# Patient Record
Sex: Female | Born: 1960 | Race: White | Hispanic: No | Marital: Married | Smoking: Current every day smoker
Health system: Southern US, Community
[De-identification: ages and names within clinical notes are randomized; demographics above are authoritative.]

## PROBLEM LIST (undated history)

## (undated) DIAGNOSIS — F329 Major depressive disorder, single episode, unspecified: Secondary | ICD-10-CM

## (undated) DIAGNOSIS — Z86010 Personal history of colonic polyps: Secondary | ICD-10-CM

## (undated) DIAGNOSIS — F32A Depression, unspecified: Secondary | ICD-10-CM

## (undated) DIAGNOSIS — M1990 Unspecified osteoarthritis, unspecified site: Secondary | ICD-10-CM

## (undated) DIAGNOSIS — K219 Gastro-esophageal reflux disease without esophagitis: Secondary | ICD-10-CM

## (undated) HISTORY — DX: Depression, unspecified: F32.A

## (undated) HISTORY — DX: Personal history of colonic polyps: Z86.010

## (undated) HISTORY — DX: Major depressive disorder, single episode, unspecified: F32.9

---

## 2011-06-05 DIAGNOSIS — Z86010 Personal history of colonic polyps: Secondary | ICD-10-CM

## 2011-06-05 HISTORY — DX: Personal history of colonic polyps: Z86.010

## 2011-09-03 HISTORY — PX: COLONOSCOPY: SHX174

## 2012-09-10 ENCOUNTER — Other Ambulatory Visit (HOSPITAL_COMMUNITY): Payer: Self-pay | Admitting: Orthopaedic Surgery

## 2012-09-10 DIAGNOSIS — M25561 Pain in right knee: Secondary | ICD-10-CM

## 2012-09-12 ENCOUNTER — Encounter (HOSPITAL_COMMUNITY): Payer: Self-pay

## 2012-09-12 ENCOUNTER — Ambulatory Visit (HOSPITAL_COMMUNITY)
Admission: RE | Admit: 2012-09-12 | Discharge: 2012-09-12 | Disposition: A | Discharge status: Home / Self Care | Payer: Medicaid Other | Source: Ambulatory Visit | Attending: Orthopaedic Surgery | Admitting: Orthopaedic Surgery

## 2012-09-12 DIAGNOSIS — M25561 Pain in right knee: Secondary | ICD-10-CM

## 2012-09-12 DIAGNOSIS — M84453A Pathological fracture, unspecified femur, initial encounter for fracture: Secondary | ICD-10-CM | POA: Insufficient documentation

## 2012-09-12 DIAGNOSIS — M25569 Pain in unspecified knee: Secondary | ICD-10-CM | POA: Insufficient documentation

## 2012-09-18 ENCOUNTER — Encounter: Payer: Self-pay | Admitting: Gastroenterology

## 2012-09-18 ENCOUNTER — Ambulatory Visit (INDEPENDENT_AMBULATORY_CARE_PROVIDER_SITE_OTHER): Payer: Medicaid Other | Admitting: Gastroenterology

## 2012-09-18 VITALS — BP 119/67 | HR 83 | Temp 97.8°F | Ht 65.0 in | Wt 196.0 lb

## 2012-09-18 DIAGNOSIS — R1011 Right upper quadrant pain: Secondary | ICD-10-CM

## 2012-09-18 DIAGNOSIS — K219 Gastro-esophageal reflux disease without esophagitis: Secondary | ICD-10-CM

## 2012-09-18 DIAGNOSIS — K5900 Constipation, unspecified: Secondary | ICD-10-CM | POA: Insufficient documentation

## 2012-09-18 NOTE — Patient Instructions (Addendum)
Continue pantoprazole for heartburn and abdominal pain.  Resume MiraLax 1 capful twice a day for 3 days, then 1 capful daily as needed for constipation.  We will request a copy of your abdominal ultrasound and recent blood work from Dr. Margaretmary Eddy office. We have scheduled you for an upper endoscopy with Dr. Jena Gauss. Please see separate instructions.

## 2012-09-18 NOTE — Progress Notes (Signed)
Primary Care Physician:  Kirstie Peri, MD  Primary Gastroenterologist:  Roetta Sessions, MD   Chief Complaint  Patient presents with  . Abdominal Pain  . Bloated  . Constipation    HPI:  Crystal Deleon is a 52 y.o. female here for further evaluation fo abdominal pain, bloating, constipation. Symptoms present for over one year. PP RUQ swelling and feels pressure under right rib. Lot of flatulence. Intermittent RUQ pain. Gained a lot of weight up about 35 pounds. Swelling in RUQ if walks a lot. C/O constipation. Frequent straining. No melena, brbpr. Could not quantify how often. Positive for mucous. Has been on Vicodin for 6 weeks for knee. Just started naproxen two weeks ago. Heartburn real bad. Rolaids daily.  Four episodes of severe burning epigastric pain, 10-15 minutes, radiating into neck. Protonix just started. Previously on Nexium but didn't help. No esophageal dysphagia.   Current Outpatient Prescriptions  Medication Sig Dispense Refill  . citalopram (CELEXA) 20 MG tablet Take 20 mg by mouth daily.      Marland Kitchen HYDROcodone-acetaminophen (NORCO/VICODIN) 5-325 MG per tablet Take 1 tablet by mouth every 6 (six) hours as needed for pain.      . naproxen (NAPROSYN) 500 MG tablet Take 500 mg by mouth 2 (two) times daily with a meal.      . pantoprazole (PROTONIX) 40 MG tablet Take 40 mg by mouth daily.       No current facility-administered medications for this visit.    Allergies as of 09/18/2012  . (No Known Allergies)    Past Medical History  Diagnosis Date  . Depression   . Hx of adenomatous colonic polyps 2013    Past Surgical History  Procedure Laterality Date  . Colonoscopy  09/2011    Dr. Adah Perl diverticulosis in sigmoid colon. Multiple tubular adenomas removed (4). Recommended to have next TCS in 09/2014     Family History  Problem Relation Age of Onset  . Colon cancer Maternal Uncle     greater than age 29  . Liver disease Neg Hx   . Diabetes Mother   .  Diabetes Father     History   Social History  . Marital Status: Married    Spouse Name: N/A    Number of Children: 5  . Years of Education: N/A   Occupational History  . Not on file.   Social History Main Topics  . Smoking status: Current Every Day Smoker -- 0.50 packs/day    Types: Cigarettes  . Smokeless tobacco: Not on file  . Alcohol Use: No  . Drug Use: No  . Sexually Active: Not on file   Other Topics Concern  . Not on file   Social History Narrative  . No narrative on file      ROS:  General: Negative for anorexia, weight loss, fever, chills, fatigue, weakness. Eyes: Negative for vision changes.  ENT: Negative for hoarseness, difficulty swallowing , nasal congestion. CV: Negative for chest pain, angina, palpitations, dyspnea on exertion, peripheral edema.  Respiratory: Negative for dyspnea at rest, dyspnea on exertion, cough, sputum, wheezing.  GI: See history of present illness. GU:  Negative for dysuria, hematuria, urinary incontinence, urinary frequency, nocturnal urination.  MS: Positive for joint pain. No low back pain.  Derm: Negative for rash or itching.  Neuro: Negative for weakness, abnormal sensation, seizure, frequent headaches, memory loss, confusion.  Psych: Negative for anxiety, depression, suicidal ideation, hallucinations.  Endo: Weight gain.  Heme: Negative for bruising or bleeding. Allergy: Negative  for rash or hives.    Physical Examination:  BP 119/67  Pulse 83  Temp(Src) 97.8 F (36.6 C) (Oral)  Ht 5\' 5"  (1.651 m)  Wt 196 lb (88.905 kg)  BMI 32.62 kg/m2   General: Well-nourished, well-developed in no acute distress.  Head: Normocephalic, atraumatic.   Eyes: Conjunctiva pink, no icterus. Mouth: Oropharyngeal mucosa moist and pink , no lesions erythema or exudate. Neck: Supple without thyromegaly, masses, or lymphadenopathy.  Lungs: Clear to auscultation bilaterally.  Heart: Regular rate and rhythm, no murmurs rubs or gallops.   Abdomen: Bowel sounds are normal, RUQ tenderness with palpation, nondistended, no hepatosplenomegaly or masses, no abdominal bruits or    hernia , no rebound or guarding.   Rectal: not performed Extremities: No lower extremity edema. No clubbing or deformities.  Neuro: Alert and oriented x 4 , grossly normal neurologically.  Skin: Warm and dry, no rash or jaundice.   Psych: Alert and cooperative, normal mood and affect.  Labs: Requested labs from PCP.

## 2012-09-19 NOTE — Assessment & Plan Note (Signed)
53 y/o female with one year h/o postprandial RUQ pain, swelling, refractory GERD. Recommend EGD to evaluate for gastritis, PUD, complicated GERD.  I have discussed the risks, alternatives, benefits with regards to but not limited to the risk of reaction to medication, bleeding, infection, perforation and the patient is agreeable to proceed. Written consent to be obtained.  I have requested labs and abd u/s from [facility]/Dr. Sherryll Burger for review.

## 2012-09-19 NOTE — Assessment & Plan Note (Signed)
Trial of Miralax.

## 2012-09-19 NOTE — Progress Notes (Signed)
Received records from [facility]/Dr. Sherryll Burger Labs from 10 /2013: WBC 11000, H/H 14/41.4, Platelet 283000, Cre 0.62,Ca 10.1, Tbili 0.2, AP 137H, AST 20, ALT 26, Alb 4.3  Abd u/s on 01/2012: Fatty liver.

## 2012-09-19 NOTE — Assessment & Plan Note (Signed)
Recently started Protonix, continue for now. EGD as planned.

## 2012-09-22 ENCOUNTER — Encounter: Payer: Self-pay | Admitting: Gastroenterology

## 2012-09-22 NOTE — Progress Notes (Signed)
Cc PCP 

## 2012-09-24 LAB — COMPREHENSIVE METABOLIC PANEL
ALT: 26 U/L (ref 7–35)
BUN: 12 mg/dL (ref 4–21)
Calcium: 10.1 mg/dL
Creat: 0.62
Glucose: 110
Total Bilirubin: 0.2 mg/dL

## 2012-09-24 LAB — CBC: Hemoglobin: 14 g/dL (ref 12.0–16.0)

## 2012-10-06 ENCOUNTER — Encounter (HOSPITAL_COMMUNITY)
Admission: RE | Disposition: A | Discharge status: Home / Self Care | Payer: Self-pay | Source: Ambulatory Visit | Attending: Internal Medicine

## 2012-10-06 ENCOUNTER — Ambulatory Visit (HOSPITAL_COMMUNITY)
Admission: RE | Admit: 2012-10-06 | Discharge: 2012-10-06 | Disposition: A | Discharge status: Home / Self Care | Payer: Medicaid Other | Source: Ambulatory Visit | Attending: Internal Medicine | Admitting: Internal Medicine

## 2012-10-06 ENCOUNTER — Encounter (HOSPITAL_COMMUNITY): Payer: Self-pay | Admitting: *Deleted

## 2012-10-06 DIAGNOSIS — R141 Gas pain: Secondary | ICD-10-CM | POA: Insufficient documentation

## 2012-10-06 DIAGNOSIS — Z860100 Personal history of colon polyps, unspecified: Secondary | ICD-10-CM | POA: Insufficient documentation

## 2012-10-06 DIAGNOSIS — F329 Major depressive disorder, single episode, unspecified: Secondary | ICD-10-CM | POA: Insufficient documentation

## 2012-10-06 DIAGNOSIS — K228 Other specified diseases of esophagus: Secondary | ICD-10-CM | POA: Insufficient documentation

## 2012-10-06 DIAGNOSIS — K5900 Constipation, unspecified: Secondary | ICD-10-CM | POA: Insufficient documentation

## 2012-10-06 DIAGNOSIS — K2940 Chronic atrophic gastritis without bleeding: Secondary | ICD-10-CM | POA: Insufficient documentation

## 2012-10-06 DIAGNOSIS — K210 Gastro-esophageal reflux disease with esophagitis: Secondary | ICD-10-CM

## 2012-10-06 DIAGNOSIS — F3289 Other specified depressive episodes: Secondary | ICD-10-CM | POA: Insufficient documentation

## 2012-10-06 DIAGNOSIS — K2289 Other specified disease of esophagus: Secondary | ICD-10-CM | POA: Insufficient documentation

## 2012-10-06 DIAGNOSIS — R142 Eructation: Secondary | ICD-10-CM | POA: Insufficient documentation

## 2012-10-06 DIAGNOSIS — K219 Gastro-esophageal reflux disease without esophagitis: Secondary | ICD-10-CM | POA: Insufficient documentation

## 2012-10-06 DIAGNOSIS — K2960 Other gastritis without bleeding: Secondary | ICD-10-CM

## 2012-10-06 DIAGNOSIS — Z800 Family history of malignant neoplasm of digestive organs: Secondary | ICD-10-CM | POA: Insufficient documentation

## 2012-10-06 DIAGNOSIS — Z79899 Other long term (current) drug therapy: Secondary | ICD-10-CM | POA: Insufficient documentation

## 2012-10-06 DIAGNOSIS — R1011 Right upper quadrant pain: Secondary | ICD-10-CM | POA: Insufficient documentation

## 2012-10-06 DIAGNOSIS — F17200 Nicotine dependence, unspecified, uncomplicated: Secondary | ICD-10-CM | POA: Insufficient documentation

## 2012-10-06 HISTORY — PX: ESOPHAGOGASTRODUODENOSCOPY: SHX5428

## 2012-10-06 SURGERY — EGD (ESOPHAGOGASTRODUODENOSCOPY)
Anesthesia: Moderate Sedation

## 2012-10-06 MED ORDER — ONDANSETRON HCL 4 MG/2ML IJ SOLN
INTRAMUSCULAR | Status: DC | PRN
  Administered 2012-10-06: 4 mg via INTRAVENOUS

## 2012-10-06 MED ORDER — MIDAZOLAM HCL 5 MG/5ML IJ SOLN
INTRAMUSCULAR | Status: DC | PRN
  Administered 2012-10-06 (×2): 2 mg via INTRAVENOUS

## 2012-10-06 MED ORDER — SODIUM CHLORIDE 0.9 % IV SOLN
INTRAVENOUS | Status: DC
  Administered 2012-10-06: 13:00:00 via INTRAVENOUS

## 2012-10-06 MED ORDER — MEPERIDINE HCL 100 MG/ML IJ SOLN
INTRAMUSCULAR | Status: AC
  Filled 2012-10-06: qty 2

## 2012-10-06 MED ORDER — BUTAMBEN-TETRACAINE-BENZOCAINE 2-2-14 % EX AERO
INHALATION_SPRAY | CUTANEOUS | Status: DC | PRN
  Administered 2012-10-06: 2 via TOPICAL

## 2012-10-06 MED ORDER — MEPERIDINE HCL 100 MG/ML IJ SOLN
INTRAMUSCULAR | Status: DC | PRN
  Administered 2012-10-06: 25 mg via INTRAVENOUS
  Administered 2012-10-06: 50 mg via INTRAVENOUS

## 2012-10-06 MED ORDER — STERILE WATER FOR IRRIGATION IR SOLN
Status: DC | PRN
  Administered 2012-10-06: 13:00:00

## 2012-10-06 MED ORDER — ONDANSETRON HCL 4 MG/2ML IJ SOLN
INTRAMUSCULAR | Status: AC
  Filled 2012-10-06: qty 2

## 2012-10-06 MED ORDER — MIDAZOLAM HCL 5 MG/5ML IJ SOLN
INTRAMUSCULAR | Status: AC
  Filled 2012-10-06: qty 10

## 2012-10-06 NOTE — Interval H&P Note (Signed)
History and Physical Interval Note:  10/06/2012 1:24 PM  Crystal Deleon  has presented today for surgery, with the diagnosis of RUQ Pain, GERD and Constipation  The various methods of treatment have been discussed with the patient and family. After consideration of risks, benefits and other options for treatment, the patient has consented to  Procedure(s) with comments: ESOPHAGOGASTRODUODENOSCOPY (EGD) (N/A) - 1:00-moved to [address] to notify pt as a surgical intervention .  The patient's history has been reviewed, patient examined, no change in status, stable for surgery.  I have reviewed the patient's chart and labs.  Questions were answered to the patient's satisfaction.     Continues to have postprandial right upper quadrant abdominal pain. Protonix has not helped. No dysphagia. EGD today per per plan.  The risks, benefits, limitations, alternatives and imponderables have been reviewed with the patient. Potential for esophageal dilation, biopsy, etc. have also been reviewed.  Questions have been answered. All parties agreeable.  Eula Listen

## 2012-10-06 NOTE — Op Note (Signed)
[facility] [address] [place], [address]   ENDOSCOPY PROCEDURE REPORT  PATIENT: Crystal, Deleon  MR#: [id number] BIRTHDATE: [birth year] , 52  yrs. old GENDER: Female ENDOSCOPIST: R.  Roetta Sessions, MD FACP FACG REFERRED BY:  Kirstie Peri, M.D. PROCEDURE DATE:  10/06/2012 PROCEDURE:     EGD with gastric biopsy  INDICATIONS:     Right upper quadrant abdominal pain; no improvement with a course of Protonix  INFORMED CONSENT:   The risks, benefits, limitations, alternatives and imponderables have been discussed.  The potential for biopsy, esophogeal dilation, etc. have also been reviewed.  Questions have been answered.  All parties agreeable.  Please see the history and physical in the medical record for more information.  MEDICATIONS:  Versed 4 mg IV and Demerol 75 mg IV and Zofran 4 mg IV. Cetacaine spray to  DESCRIPTION OF PROCEDURE:   The EG-[id number] ([id number])  endoscope was introduced through the mouth and advanced to the second portion of the duodenum without difficulty or limitations.  The mucosal surfaces were surveyed very carefully during advancement of the scope and upon withdrawal.  Retroflexion view of the proximal stomach and esophagogastric junction was performed.      FINDINGS: Small esophageal erosions within 5 mm the GE junction. No Barrett's esophagus. Stomach empty. Antral and body erosions. No ulcer or infiltrating process. Patent pylorus. Examination first and second portion of duodenum revealed multiple areas of bulbar rouge surrounding erythema. No ulcer or infiltrating process observed.  THERAPEUTIC / DIAGNOSTIC MANEUVERS PERFORMED:  Biopsies abnormal gastric antrum and body taken for histologic study   COMPLICATIONS:  None  IMPRESSION:   Mild erosive reflux esophagitis. Gastric and bulbar erosions of uncertain significance-status post gastric biopsy. Patient's symptoms do not at all sound entirely typical for  reflux.  RECOMMENDATIONS:  Continue Protonix 40 mg daily. Followup on pathology.    _______________________________ R. Roetta Sessions, MD FACP [facility] eSigned:  R. Roetta Sessions, MD FACP [facility] 10/06/2012 1:52 PM     CC:  PATIENT NAME:  Crystal, Deleon MR#: [id number]

## 2012-10-06 NOTE — H&P (View-Only) (Signed)
Primary Care Physician:  SHAH,ASHISH, MD  Primary Gastroenterologist:  Michael Rourk, MD   Chief Complaint  Patient presents with  . Abdominal Pain  . Bloated  . Constipation    HPI:  Crystal Deleon is a 52 y.o. female here for further evaluation fo abdominal pain, bloating, constipation. Symptoms present for over one year. PP RUQ swelling and feels pressure under right rib. Lot of flatulence. Intermittent RUQ pain. Gained a lot of weight up about 35 pounds. Swelling in RUQ if walks a lot. C/O constipation. Frequent straining. No melena, brbpr. Could not quantify how often. Positive for mucous. Has been on Vicodin for 6 weeks for knee. Just started naproxen two weeks ago. Heartburn real bad. Rolaids daily.  Four episodes of severe burning epigastric pain, 10-15 minutes, radiating into neck. Protonix just started. Previously on Nexium but didn't help. No esophageal dysphagia.   Current Outpatient Prescriptions  Medication Sig Dispense Refill  . citalopram (CELEXA) 20 MG tablet Take 20 mg by mouth daily.      . HYDROcodone-acetaminophen (NORCO/VICODIN) 5-325 MG per tablet Take 1 tablet by mouth every 6 (six) hours as needed for pain.      . naproxen (NAPROSYN) 500 MG tablet Take 500 mg by mouth 2 (two) times daily with a meal.      . pantoprazole (PROTONIX) 40 MG tablet Take 40 mg by mouth daily.       No current facility-administered medications for this visit.    Allergies as of 09/18/2012  . (No Known Allergies)    Past Medical History  Diagnosis Date  . Depression   . Hx of adenomatous colonic polyps 2013    Past Surgical History  Procedure Laterality Date  . Colonoscopy  09/2011    Dr. Benson-->mild diverticulosis in sigmoid colon. Multiple tubular adenomas removed (4). Recommended to have next TCS in 09/2014     Family History  Problem Relation Age of Onset  . Colon cancer Maternal Uncle     greater than age 60  . Liver disease Neg Hx   . Diabetes Mother   .  Diabetes Father     History   Social History  . Marital Status: Married    Spouse Name: N/A    Number of Children: 5  . Years of Education: N/A   Occupational History  . Not on file.   Social History Main Topics  . Smoking status: Current Every Day Smoker -- 0.50 packs/day    Types: Cigarettes  . Smokeless tobacco: Not on file  . Alcohol Use: No  . Drug Use: No  . Sexually Active: Not on file   Other Topics Concern  . Not on file   Social History Narrative  . No narrative on file      ROS:  General: Negative for anorexia, weight loss, fever, chills, fatigue, weakness. Eyes: Negative for vision changes.  ENT: Negative for hoarseness, difficulty swallowing , nasal congestion. CV: Negative for chest pain, angina, palpitations, dyspnea on exertion, peripheral edema.  Respiratory: Negative for dyspnea at rest, dyspnea on exertion, cough, sputum, wheezing.  GI: See history of present illness. GU:  Negative for dysuria, hematuria, urinary incontinence, urinary frequency, nocturnal urination.  MS: Positive for joint pain. No low back pain.  Derm: Negative for rash or itching.  Neuro: Negative for weakness, abnormal sensation, seizure, frequent headaches, memory loss, confusion.  Psych: Negative for anxiety, depression, suicidal ideation, hallucinations.  Endo: Weight gain.  Heme: Negative for bruising or bleeding. Allergy: Negative   for rash or hives.    Physical Examination:  BP 119/67  Pulse 83  Temp(Src) 97.8 F (36.6 C) (Oral)  Ht 5' 5" (1.651 m)  Wt 196 lb (88.905 kg)  BMI 32.62 kg/m2   General: Well-nourished, well-developed in no acute distress.  Head: Normocephalic, atraumatic.   Eyes: Conjunctiva pink, no icterus. Mouth: Oropharyngeal mucosa moist and pink , no lesions erythema or exudate. Neck: Supple without thyromegaly, masses, or lymphadenopathy.  Lungs: Clear to auscultation bilaterally.  Heart: Regular rate and rhythm, no murmurs rubs or gallops.   Abdomen: Bowel sounds are normal, RUQ tenderness with palpation, nondistended, no hepatosplenomegaly or masses, no abdominal bruits or    hernia , no rebound or guarding.   Rectal: not performed Extremities: No lower extremity edema. No clubbing or deformities.  Neuro: Alert and oriented x 4 , grossly normal neurologically.  Skin: Warm and dry, no rash or jaundice.   Psych: Alert and cooperative, normal mood and affect.  Labs: Requested labs from PCP.  

## 2012-10-07 ENCOUNTER — Encounter (HOSPITAL_COMMUNITY): Payer: Self-pay | Admitting: Internal Medicine

## 2012-10-09 ENCOUNTER — Telehealth: Payer: Self-pay

## 2012-10-09 ENCOUNTER — Other Ambulatory Visit: Payer: Self-pay | Admitting: Internal Medicine

## 2012-10-09 DIAGNOSIS — R1011 Right upper quadrant pain: Secondary | ICD-10-CM

## 2012-10-09 NOTE — Telephone Encounter (Signed)
Per RMR- pt needs HIDA with [facility]. Benedetto Goad, please schedule.

## 2012-10-09 NOTE — Telephone Encounter (Signed)
Patient is scheduled for a HIDA scan on Monday May 12 at 10:00 and she is aware

## 2012-10-11 ENCOUNTER — Encounter: Payer: Self-pay | Admitting: Internal Medicine

## 2012-10-13 ENCOUNTER — Encounter (HOSPITAL_COMMUNITY): Payer: Self-pay

## 2012-10-13 ENCOUNTER — Encounter (HOSPITAL_COMMUNITY)
Admission: RE | Admit: 2012-10-13 | Discharge: 2012-10-13 | Disposition: A | Discharge status: Home / Self Care | Payer: Medicaid Other | Source: Ambulatory Visit | Attending: Internal Medicine | Admitting: Internal Medicine

## 2012-10-13 DIAGNOSIS — R1011 Right upper quadrant pain: Secondary | ICD-10-CM

## 2012-10-13 MED ORDER — SINCALIDE 5 MCG IJ SOLR
0.0200 ug/kg | Freq: Once | INTRAMUSCULAR | Status: AC
  Administered 2012-10-13: 1.78 ug via INTRAVENOUS

## 2012-10-13 MED ORDER — TECHNETIUM TC 99M MEBROFENIN IV KIT
5.0000 | PACK | Freq: Once | INTRAVENOUS | Status: AC | PRN
  Administered 2012-10-13: 5 via INTRAVENOUS

## 2012-10-17 ENCOUNTER — Telehealth: Payer: Self-pay | Admitting: Internal Medicine

## 2012-10-17 NOTE — Telephone Encounter (Signed)
Pt called for her results on procedure she said she had done on Monday. Please advise if they are back and call her at [phone]

## 2012-10-20 NOTE — Telephone Encounter (Signed)
Tried to call pt- LMOM 

## 2012-10-20 NOTE — Telephone Encounter (Signed)
Spoke with pt about normal HIDA results. Pt stated she is still swelling in that area and has a lot of pain and nausea with eating. She is taking the protonix bid. She is agreeable to come in for ov,but She wants to know what she can do while she waits for ov. Please advise.

## 2012-10-23 NOTE — Telephone Encounter (Signed)
I already addressed  HIDA results on 5/19. Patient should have already been notified. Please check on this.

## 2012-10-23 NOTE — Telephone Encounter (Signed)
Pt has already been informed of her results. Has appt for 11/26/12. Darl Pikes, please see if you can get this pt in sooner. Thanks.

## 2012-10-23 NOTE — Telephone Encounter (Signed)
Pt is aware of OV on 5/28 at 0930 and the OV in June will be cancelled

## 2012-10-28 ENCOUNTER — Encounter: Payer: Self-pay | Admitting: Internal Medicine

## 2012-10-29 ENCOUNTER — Ambulatory Visit (INDEPENDENT_AMBULATORY_CARE_PROVIDER_SITE_OTHER): Payer: Medicaid Other | Admitting: Gastroenterology

## 2012-10-29 ENCOUNTER — Encounter: Payer: Self-pay | Admitting: Gastroenterology

## 2012-10-29 VITALS — BP 122/71 | HR 81 | Temp 98.1°F | Ht 64.0 in | Wt 196.0 lb

## 2012-10-29 DIAGNOSIS — R1011 Right upper quadrant pain: Secondary | ICD-10-CM

## 2012-10-29 LAB — HEPATIC FUNCTION PANEL
ALT: 29 U/L (ref 0–35)
AST: 23 U/L (ref 0–37)
Bilirubin, Direct: <0.1 mg/dL (ref 0.0–0.3)
Total Protein: 6.7 g/dL (ref 6.0–8.3)

## 2012-10-29 LAB — LIPASE: Lipase: 22 U/L (ref 0–75)

## 2012-10-29 NOTE — Patient Instructions (Addendum)
Please have blood work done. We will call with results.  We have ordered a CT scan of your belly. This will help evaluate you further. Further recommendations in the near future.

## 2012-10-29 NOTE — Progress Notes (Signed)
Referring Provider: Kirstie Peri, MD Primary Care Physician:  Kirstie Peri, MD Primary GI: Dr. Jena Gauss    Chief Complaint  Patient presents with  . Abdominal Pain    HPI:   Crystal Deleon is a 52 year old female presenting today in follow-up after EGD by Dr. Jena Gauss performed secondary to RUQ pain. Mild erosive esophagitis noted, negative H.pylori. HIDA subsequently performed with a normal EF of 92.9% and NO symptoms experienced during [facility] infusion. LFTs in Oct 2013 normal, abdominal US in Aug 2013 with fatty liver.  Notes RUQ pain, "killing me ever since" the HIDA. Worsened. Now constant. States skin feels numb on right side. Worsened with eating/drinking. "starts acting up". No N/V. Abdomen feels swollen. States pain is worse with walking. Denies any back issues. No abdominal surgeries. No significant issues with bowel habits. At times will have constipation issues, Miralax used prn.   Past Medical History  Diagnosis Date  . Depression   . Hx of adenomatous colonic polyps 2013    Past Surgical History  Procedure Laterality Date  . Colonoscopy  09/2011    Dr. Adah Perl diverticulosis in sigmoid colon. Multiple tubular adenomas removed (4). Recommended to have next TCS in 09/2014   . Esophagogastroduodenoscopy N/A 10/06/2012    [id number] erosive reflux esophagitis/Gastric and bulbar erosions of uncertain significance s/p gastric biopsy    Current Outpatient Prescriptions  Medication Sig Dispense Refill  . citalopram (CELEXA) 20 MG tablet Take 20 mg by mouth daily.      Marland Kitchen HYDROcodone-acetaminophen (NORCO/VICODIN) 5-325 MG per tablet Take 1 tablet by mouth every 6 (six) hours as needed for pain.      . naproxen (NAPROSYN) 500 MG tablet Take 500 mg by mouth 2 (two) times daily with a meal.      . pantoprazole (PROTONIX) 40 MG tablet Take 40 mg by mouth daily.      . polyethylene glycol powder (MIRALAX) powder Take 17 g by mouth at bedtime as needed (constipation). Take 1 capful twice a day for  3 days, then take 1 capful at bedtime as needed.  527 g  11   No current facility-administered medications for this visit.    Allergies as of 10/29/2012  . (No Known Allergies)    Family History  Problem Relation Age of Onset  . Colon cancer Maternal Uncle     greater than age 54  . Liver disease Neg Hx   . Diabetes Mother   . Diabetes Father     History   Social History  . Marital Status: Married    Spouse Name: N/A    Number of Children: 5  . Years of Education: N/A   Social History Main Topics  . Smoking status: Current Every Day Smoker -- 0.50 packs/day for 34 years    Types: Cigarettes  . Smokeless tobacco: None  . Alcohol Use: No  . Drug Use: No  . Sexually Active: None   Other Topics Concern  . None   Social History Narrative  . None    Review of Systems: Negative unless mentioned in HPI  Physical Exam: BP 122/71  Pulse 81  Temp(Src) 98.1 F (36.7 C) (Oral)  Ht 5\' 4"  (1.626 m)  Wt 196 lb (88.905 kg)  BMI 33.63 kg/m2 General:   Alert and oriented. No distress noted. Pleasant and cooperative.  Head:  Normocephalic and atraumatic. Eyes:  Conjuctiva clear without scleral icterus. Mouth:  Oral mucosa pink and moist. Good dentition. No lesions. Heart:  S1, S2 present without murmurs,  rubs, or gallops. Regular rate and rhythm. Abdomen:  +BS, soft, non-tender and non-distended. No rebound or guarding. No HSM or masses noted. Msk:  Symmetrical without gross deformities. Normal posture. Extremities:  Without edema. Neurologic:  Alert and  oriented x4;  grossly normal neurologically. Skin:  Intact without significant lesions or rashes. Psych:  Alert and cooperative. Normal mood and affect.

## 2012-10-30 DIAGNOSIS — R1011 Right upper quadrant pain: Secondary | ICD-10-CM | POA: Insufficient documentation

## 2012-10-30 NOTE — Assessment & Plan Note (Signed)
52 year old female with worsening RUQ pain/discomfort but negative findings from EGD, [place] with fatty liver, and a negative HIDA scan with good EF. Interestingly, she states her skin feels "numb" at the site of discomfort. No prior abdominal surgeries or association with bowel habits. Symptoms worsened at times with eating/drinking. Associated feelings of bloating, worse with walking. Question a musculoskeletal component at this point, doubt GI issue or biliary dyskinesia. Will recheck HFP, lipase, and proceed with CT of the abdomen. Could possibly refer to surgery for consideration of elective cholecystectomy, but symptoms do not seem to be completely all related to a biliary issue. Further recommendations after CT and labs reviewed.

## 2012-10-31 NOTE — Progress Notes (Signed)
Cc PCP 

## 2012-11-10 ENCOUNTER — Telehealth: Payer: Self-pay | Admitting: Internal Medicine

## 2012-11-10 NOTE — Telephone Encounter (Signed)
Spoke with pt

## 2012-11-10 NOTE — Telephone Encounter (Signed)
Pt called to see if her results were back yet and she said we were to schedule her another test and was following up on that as well. Please advise [phone]

## 2012-11-10 NOTE — Progress Notes (Signed)
Quick Note:  LFTs reviewed. Overall unimpressive, alk phos is actually improved and almost normalized. Non-specific elevation in past. Would repeat in 6 months fasting. Lipase normal. CT pending approval of insurance company. ______

## 2012-11-10 NOTE — Progress Notes (Signed)
Quick Note:  Tried to call pt- LMOM ______ 

## 2012-11-10 NOTE — Progress Notes (Signed)
Quick Note:  Spoke with Rosey Bath at [phone]. States since it was denied on May 29, it has been past 5 business days and I am not able to do a peer to peer review. It was denied on the basis of inadequate information for procedure per Rosey Bath. She stated we could reorder this and "see what happens". I have let Soledad Gerlach know. She will be placing another order. ______

## 2012-11-10 NOTE — Progress Notes (Signed)
I have placed a call to [phone] and left a message with Rosey Bath, who takes care of appeals. Awaiting a call back.

## 2012-11-10 NOTE — Telephone Encounter (Signed)
Routing to AS for results. Her CT was not scheduled because her insurance denied it.

## 2012-11-10 NOTE — Telephone Encounter (Signed)
Tried to call pt- LMOM 

## 2012-11-10 NOTE — Telephone Encounter (Signed)
Please see result note under labs. I will have to call Medicaid about the CT. No reason they should not approve.

## 2012-11-11 NOTE — Progress Notes (Signed)
I reordered it again and its again under medical review

## 2012-11-17 ENCOUNTER — Telehealth: Payer: Self-pay | Admitting: Internal Medicine

## 2012-11-17 NOTE — Telephone Encounter (Signed)
Pt was returning call to JL from Friday. I told patient that she was not in office today, but I would let her know that she returned call and have nurse call her back tomorrow. [phone]

## 2012-11-18 NOTE — Telephone Encounter (Signed)
I was not here Friday and have not tried to call pt. Spoke with LW- she hasnt tried to call pt either. Will route to AS to see if she has been trying to get in touch with pt.

## 2012-11-26 ENCOUNTER — Ambulatory Visit: Payer: Medicaid Other | Admitting: Gastroenterology

## 2012-11-27 NOTE — Telephone Encounter (Signed)
Patient called back wanting to know if Crystal Deleon ever called Medicaid about prior approval for ct scan.  [name]-have you reached out to Medicaid?

## 2012-11-27 NOTE — Telephone Encounter (Signed)
Yes, I contacted Medicaid again prior to my CME/vacation. It is outlined in my office note. We had to regenerate a new request. When I returned this week, it was on my desk that it had been denied a second time. I just got off the phone from Dr. Earlene Plater, medical reviewer. I had to demand a peer-to-peer review, because they were stating we were "out of the 5 day window". Dr. Earlene Plater was supportive of proceeding with the CT scan and told me she was unsure why it was not approved when we appealed. She is sending it to [employer] so they can make sure it is addressed on their end for future efforts.   For now, it is verbally approved. NEW case #: [id number]. Approval will come in 24 hours.  Please let the patient know it has been somewhat of a hurdle to get approved, but we appreciate her bearing with [place].

## 2012-12-02 NOTE — Telephone Encounter (Signed)
CT has finally been approved and Crystal Deleon is scheduled for Thursday July 3rd at 8:45 and she is aware to go by [facility] and pick up the oral contrast that she will need to drink

## 2012-12-02 NOTE — Telephone Encounter (Signed)
Great. Thanks to everyone for their help.

## 2012-12-04 ENCOUNTER — Ambulatory Visit (HOSPITAL_COMMUNITY)
Admission: RE | Admit: 2012-12-04 | Discharge: 2012-12-04 | Disposition: A | Discharge status: Home / Self Care | Payer: Medicaid Other | Source: Ambulatory Visit | Attending: Gastroenterology | Admitting: Gastroenterology

## 2012-12-04 ENCOUNTER — Encounter (HOSPITAL_COMMUNITY): Payer: Self-pay

## 2012-12-04 DIAGNOSIS — K4020 Bilateral inguinal hernia, without obstruction or gangrene, not specified as recurrent: Secondary | ICD-10-CM | POA: Insufficient documentation

## 2012-12-04 DIAGNOSIS — R1901 Right upper quadrant abdominal swelling, mass and lump: Secondary | ICD-10-CM | POA: Insufficient documentation

## 2012-12-04 DIAGNOSIS — K7689 Other specified diseases of liver: Secondary | ICD-10-CM | POA: Insufficient documentation

## 2012-12-04 DIAGNOSIS — R1011 Right upper quadrant pain: Secondary | ICD-10-CM | POA: Insufficient documentation

## 2012-12-04 MED ORDER — IOHEXOL 300 MG/ML  SOLN
100.0000 mL | Freq: Once | INTRAMUSCULAR | Status: AC | PRN
  Administered 2012-12-04: 100 mL via INTRAVENOUS

## 2012-12-08 NOTE — Progress Notes (Signed)
Quick Note:  Fatty liver on CT. Small inguinal hernias containing fat.  Nothing on CT to explain pain.  Let's have her follow-up with RMR, non-urgent.   ______

## 2012-12-09 ENCOUNTER — Encounter: Payer: Self-pay | Admitting: Internal Medicine

## 2012-12-17 NOTE — Progress Notes (Signed)
REVIEWED.  

## 2012-12-30 ENCOUNTER — Encounter: Payer: Self-pay | Admitting: Internal Medicine

## 2012-12-30 ENCOUNTER — Ambulatory Visit (INDEPENDENT_AMBULATORY_CARE_PROVIDER_SITE_OTHER): Payer: Medicaid Other | Admitting: Internal Medicine

## 2012-12-30 VITALS — BP 126/76 | HR 80 | Temp 98.4°F | Ht 64.0 in | Wt 201.9 lb

## 2012-12-30 DIAGNOSIS — G8929 Other chronic pain: Secondary | ICD-10-CM

## 2012-12-30 DIAGNOSIS — R1011 Right upper quadrant pain: Secondary | ICD-10-CM

## 2012-12-30 NOTE — Patient Instructions (Addendum)
lidoderm patches x 2 weeks  If no better in 2 weeks, surgical opinion - Dr. Lovell Sheehan  May need to see a pain management specialist  Repeat Liver enzymes in 3 months

## 2012-12-30 NOTE — Progress Notes (Signed)
rx called to [facility] [place].

## 2012-12-30 NOTE — Progress Notes (Signed)
Crystal Deleon . Please call in prescription for Lidoderm 5% patches. Dispense 15. Apply no more than 3 patches onto affected area for no more than 12 hours per day. One refill.

## 2012-12-30 NOTE — Progress Notes (Signed)
Primary Care Physician:  Kirstie Peri, MD Primary Gastroenterologist:  Dr. Jena Gauss  Pre-Procedure History & Physical: HPI:  Crystal Deleon is a 52 y.o. female here for further evaluation of one and a half year history of insidiously progressive right upper quadrant abdominal pain. Patient has gained 50 pounds during this time. Pain almost incessant. Worse when she takes a long walk; worse after she eats pizza. She's localizes pain to her right costal margin/ extreme right upper quadrant. No radiating qualities. Having a bowel movement does not affect the pain. She says overlying skin in this area feels "numb" No nausea or vomiting. She has occasional typical reflux symptoms which are well controlled on proton pump inhibitor therapy. Ultrasound and HIDA previously negative/normal. CT scan demonstrated a fatty liver but nothing has like a spigelian hernia. Also, No history of trauma. Has never experienced a rash in this area. No history of shingles. No history of discogenic disease. She is very frustrated.  History of minimally elevated alkaline phosphatase.  Past Medical History  Diagnosis Date  . Depression   . Hx of adenomatous colonic polyps 2013    Past Surgical History  Procedure Laterality Date  . Colonoscopy  09/2011    Dr. Adah Perl diverticulosis in sigmoid colon. Multiple tubular adenomas removed (4). Recommended to have next TCS in 09/2014   . Esophagogastroduodenoscopy N/A 10/06/2012    ZOX:WRUE erosive reflux esophagitis/Gastric and bulbar erosions of uncertain significance s/p gastric biopsy, path negative for H.pylori    Prior to Admission medications   Medication Sig Start Date End Date Taking? Authorizing Provider  HYDROcodone-acetaminophen (NORCO/VICODIN) 5-325 MG per tablet Take 1 tablet by mouth every 6 (six) hours as needed for pain.   Yes Historical Provider, MD  naproxen (NAPROSYN) 500 MG tablet Take 500 mg by mouth 2 (two) times daily with a meal.   Yes Historical  Provider, MD  citalopram (CELEXA) 20 MG tablet Take 20 mg by mouth daily.    Historical Provider, MD  pantoprazole (PROTONIX) 40 MG tablet Take 40 mg by mouth daily.    Historical Provider, MD  polyethylene glycol powder (MIRALAX) powder Take 17 g by mouth at bedtime as needed (constipation). Take 1 capful twice a day for 3 days, then take 1 capful at bedtime as needed. 09/18/12   Tiffany Kocher, PA-C    Allergies as of 12/30/2012  . (No Known Allergies)    Family History  Problem Relation Age of Onset  . Colon cancer Maternal Uncle     greater than age 41  . Liver disease Neg Hx   . Diabetes Mother   . Diabetes Father     History   Social History  . Marital Status: Married    Spouse Name: N/A    Number of Children: 5  . Years of Education: N/A   Occupational History  . Not on file.   Social History Main Topics  . Smoking status: Current Every Day Smoker -- 0.50 packs/day for 34 years    Types: Cigarettes  . Smokeless tobacco: Not on file  . Alcohol Use: No  . Drug Use: No  . Sexually Active: Not on file   Other Topics Concern  . Not on file   Social History Narrative  . No narrative on file    Review of Systems: See HPI, otherwise negative ROS  Physical Exam: BP 126/76  Pulse 80  Temp(Src) 98.4 F (36.9 C) (Oral)  Ht 5\' 4"  (1.626 m)  Wt 201 lb 14.2 oz (  91.577 kg)  BMI 34.64 kg/m2 General:   Alert, obese, pleasant and cooperative in NAD Skin:  Intact without significant lesions or rashes. Eyes:  Sclera clear, no icterus.   Conjunctiva pink. Ears:  Normal auditory acuity. Nose:  No deformity, discharge,  or lesions. Mouth:  No deformity or lesions. Neck:  Supple; no masses or thyromegaly. No significant cervical adenopathy. Lungs:  Clear throughout to auscultation.   No wheezes, crackles, or rhonchi. No acute distress. Heart:  Regular rate and rhythm; no murmurs, clicks, rubs,  or gallops. Abdomen/chest:  Positive bowel sounds. No rash the right upper  quadrant. She really has no tenderness to palpation right upper quadrant no appreciable mass or organomegaly.  Pulses:  Normal pulses noted. Extremities:  Without clubbing or edema.  Impression/Plan:  52 year old lady with persistent worsening right upper quadrant abdominal pain of one and a half years duration. Gallbladder workup negative. She has gained a significant amount of weight over the past year and a half as well.  The pain has a positional component but I am also impressed by the fact that eating a heavy meal such as pizza may also cause exacerbation. She also describes neuralgia-like pain in this area. I spent a good 20 minutes interviewing her regarding the characteristics of the pain.  Initially, I felt it was more neuropathic/musculoskeletal in origin. But she definitely gives a postprandial component which does not completely close the door on gallbladder etiology.  Fatty liver and minimally elevated alkaline phosphatase likely have nothing to do with her presenting scenario.  Recommendations:    Two-week course of Lidoderm patches. Progress report in 2 weeks. Depending on her response to cutaneous lidocaine patches, will consider general surgery referral for opinion regarding efficacy of cholecystectomy in relieving her pain. Ultimately, she may need to get to a pain management specialist as discussed today.  We'll repeat hepatic function profile in 3 months. I've encouraged weight loss.

## 2012-12-31 ENCOUNTER — Telehealth: Payer: Self-pay | Admitting: Internal Medicine

## 2012-12-31 NOTE — Telephone Encounter (Signed)
Pt was seen in office yesterday and said that the pharmacy told her she would need a PA on her rx (patches). I explained to her how the pharmacy will need to fax [place] papers to fill out for [place] to send to her insurance, etc.. She uses [facility] in [place] [phone]

## 2012-12-31 NOTE — Telephone Encounter (Signed)
Working on PA

## 2013-01-01 ENCOUNTER — Other Ambulatory Visit: Payer: Self-pay

## 2013-01-01 DIAGNOSIS — R1011 Right upper quadrant pain: Secondary | ICD-10-CM

## 2013-02-03 ENCOUNTER — Telehealth: Payer: Self-pay

## 2013-02-03 DIAGNOSIS — R1011 Right upper quadrant pain: Secondary | ICD-10-CM

## 2013-02-03 NOTE — Telephone Encounter (Signed)
Pt called- left voicemail- she stated she tried the lidoderm patches that [facility] gave her and they have not helped at all. Pt is requesting referral to surgeon to see about having gallbladder removed.  Is it ok to send to surgeon?

## 2013-02-04 NOTE — Telephone Encounter (Signed)
Okay to send patient to Dr. Lovell Sheehan for surgical opinion. May ultimately need pain management referral. GI evaluation complete otherwise

## 2013-02-04 NOTE — Telephone Encounter (Signed)
Please refer pt.  

## 2013-02-04 NOTE — Telephone Encounter (Signed)
Referral faxed to Dr Jenkins  

## 2013-02-09 NOTE — Telephone Encounter (Signed)
Per Dr.Jenkins office they will not see the patient until the pcp can give them a referral in [place] [employer].  I called Dr. Sherryll Burger office and spoke with Revonda Standard at [phone] and she was unable to complete the referral because they are not listed on the card.    I called the patient and she stated she didn't change her pcp and her social worker was working on that for her.  I called the patient's social worker and was told they made the change to her Medicaid card, however it will not go into effect until October 1. (Mrs. Crystal Deleon [phone]) Mrs. Crystal Deleon told me I could file an override with [place] [employer].  I called [place] [employer] at [phone] and filed for an override that could take up top two weeks. #[id number]  [id number]  I also asked Ms. Scarver if there was a work around for situations like this and she told me no, the policy states to file for an override or just wait until the change shows in the system on March 04, 2013.

## 2013-02-10 NOTE — Telephone Encounter (Signed)
LeighAnn----FYI

## 2013-02-17 NOTE — Telephone Encounter (Signed)
Pt is scheduled to see Dr. Leticia Penna 02/24/13@10 :30am, Pt is aware of appt

## 2013-02-17 NOTE — Telephone Encounter (Signed)
I spoke with Tresa Endo at Dr. Esmond Camper and gave them the referral# and they stated they will get her an appt.

## 2013-02-23 ENCOUNTER — Other Ambulatory Visit: Payer: Self-pay

## 2013-02-23 DIAGNOSIS — R1011 Right upper quadrant pain: Secondary | ICD-10-CM

## 2013-03-05 NOTE — H&P (Signed)
  NTS SOAP Note  Vital Signs:  Vitals as of: 03/05/2013: Systolic 140: Diastolic 85: Heart Rate 71: Temp 97.48F: Height 32ft 5in: Weight 201Lbs 0 Ounces: Pain Level 7: BMI 33.45  BMI : 33.45 kg/m2  Subjective: This 87 Years 38 Months old Female presents for of    ABDOMINAL PAIN : ,Has had right upper quadrant abdominal pain of unknown etiology for many months.  Has had an extensive workup including CT scan, U/S, and HIDA scan.  States her pain stays localized to the right upper quadrant.  Made worse with some foods.  Occ. nausea noted.  No fever, chills, jaundice.  Seems like it swells in that area.  Multiple medications have not been helpful.  Review of Symptoms:  Constitutional:  weakness    headache Eyes:unremarkable   Nose/Mouth/Throat:unremarkable Cardiovascular:  unremarkable   Respiratory:  dyspnea Gastrointestin    abdominal pain,nausea Genitourinary:unremarkable       back pain Skin:unremarkable Hematolgic/Lymphatic:unremarkable     Allergic/Immunologic:unremarkable     Past Medical History:    Reviewed   Past Medical History  Surgical History: tcs Medical Problems: chronic pain Psychiatric History:  Depression Allergies: nkda Medications: celexa, naprosyn, protonix   Social History:Reviewed  Social History  Preferred Language: English Race:  White Ethnicity: Not Hispanic / Latino Age: 52 Years 8 Months Marital Status:  M Alcohol:  No Recreational drug(s):  No   Smoking Status: Current every day smoker reviewed on 03/05/2013 Started Date: 06/04/1982 Packs per day: 1.00 Functional Status reviewed on mm/dd/yyyy ------------------------------------------------ Bathing: Normal Cooking: Normal Dressing: Normal Driving: Normal Eating: Normal Managing Meds: Normal Oral Care: Normal Shopping: Normal Toileting: Normal Transferring: Normal Walking: Normal Cognitive Status reviewed on  mm/dd/yyyy ------------------------------------------------ Attention: Normal Decision Making: Normal Language: Normal Memory: Normal Motor: Normal Perception: Normal Problem Solving: Normal Visual and Spatial: Normal   Family History:  Reviewed  Family Health History Mother, Unknown; Diabetes mellitus, unspecified type;  Father, Unknown; Diabetes mellitus, unspecified type;     Objective Information: General:  Well appearing, well nourished in no distress. Neck:  Supple without lymphadenopathy.  Heart:  RRR, no murmur Lungs:    CTA bilaterally, no wheezes, rhonchi, rales.  Breathing unlabored. Abdomen:Soft, tender in right upper quadrant to deep palpation, ND, no HSM, no masses.  Assessment:Biliary colic, abdominal pain  Diagnosis &amp; Procedure Smart Code   Plan:  Patient has abdominal pain and some symptoms consistent with biliary colic.  She understands that cholecystectomy may not relieve her of her pain and symptoms, though all other testing negative.  She would like to proceed with surgery.  Scheduled for laparoscopic cholecystectomy on 03/11/13.   Patient Education:Alternative treatments to surgery were discussed with patient (and family).  Risks and benefits  of procedure including bleeding, infection, hepatobiliary injury, and the possibility of an open procedure were fully explained to the patient (and family) who gave informed consent. Patient/family questions were addressed.  Follow-up:Pending Surgery

## 2013-03-06 NOTE — Patient Instructions (Signed)
Crystal Deleon  03/06/2013   Your procedure is scheduled on:  03/11/13  Report to [facility] at 0700 AM.  Call this number if you have problems the morning of surgery: [phone]   Remember:   Do not eat food or drink liquids after midnight.   Take these medicines the morning of surgery with A SIP OF WATER: norco, celexa, protonix   Do not wear jewelry, make-up or nail polish.  Do not wear lotions, powders, or perfumes. You may wear deodorant.  Do not shave 48 hours prior to surgery. Men may shave face and neck.  Do not bring valuables to the hospital.  [facility] is not responsible                  for any belongings or valuables.               Contacts, dentures or bridgework may not be worn into surgery.  Leave suitcase in the car. After surgery it may be brought to your room.  For patients admitted to the hospital, discharge time is determined by your                treatment team.               Patients discharged the day of surgery will not be allowed to drive  home.  Name and phone number of your driver: family  Special Instructions: Shower using CHG 2 nights before surgery and the night before surgery.  If you shower the day of surgery use CHG.  Use special wash - you have one bottle of CHG for all showers.  You should use approximately 1/3 of the bottle for each shower.   Please read over the following fact sheets that you were given: Pain Booklet, Surgical Site Infection Prevention, Anesthesia Post-op Instructions and Care and Recovery After Surgery   PATIENT INSTRUCTIONS POST-ANESTHESIA  IMMEDIATELY FOLLOWING SURGERY:  Do not drive or operate machinery for the first twenty four hours after surgery.  Do not make any important decisions for twenty four hours after surgery or while taking narcotic pain medications or sedatives.  If you develop intractable nausea and vomiting or a severe headache please notify your doctor immediately.  FOLLOW-UP:  Please make an  appointment with your surgeon as instructed. You do not need to follow up with anesthesia unless specifically instructed to do so.  WOUND CARE INSTRUCTIONS (if applicable):  Keep a dry clean dressing on the anesthesia/puncture wound site if there is drainage.  Once the wound has quit draining you may leave it open to air.  Generally you should leave the bandage intact for twenty four hours unless there is drainage.  If the epidural site drains for more than 36-48 hours please call the anesthesia department.  QUESTIONS?:  Please feel free to call your physician or the hospital operator if you have any questions, and they will be happy to assist you.      Laparoscopic Cholecystectomy Laparoscopic cholecystectomy is surgery to remove the gallbladder. The gallbladder is located slightly to the right of center in the abdomen, behind the liver. It is a concentrating and storage sac for the bile produced in the liver. Bile aids in the digestion and absorption of fats. Gallbladder disease (cholecystitis) is an inflammation of your gallbladder. This condition is usually caused by a buildup of gallstones (cholelithiasis) in your gallbladder. Gallstones can block the flow of bile, resulting in inflammation and pain. In severe cases,  emergency surgery may be required. When emergency surgery is not required, you will have time to prepare for the procedure. Laparoscopic surgery is an alternative to open surgery. Laparoscopic surgery usually has a shorter recovery time. Your common bile duct may also need to be examined and explored. Your caregiver will discuss this with you if he or she feels this should be done. If stones are found in the common bile duct, they may be removed. LET YOUR CAREGIVER KNOW ABOUT:  Allergies to food or medicine.  Medicines taken, including vitamins, herbs, eyedrops, over-the-counter medicines, and creams.  Use of steroids (by mouth or creams).  Previous problems with anesthetics or  numbing medicines.  History of bleeding problems or blood clots.  Previous surgery.  Other health problems, including diabetes and kidney problems.  Possibility of pregnancy, if this applies. RISKS AND COMPLICATIONS All surgery is associated with risks. Some problems that may occur following this procedure include:  Infection.  Damage to the common bile duct, nerves, arteries, veins, or other internal organs such as the stomach or intestines.  Bleeding.  A stone may remain in the common bile duct. BEFORE THE PROCEDURE  Do not take aspirin for 3 days prior to surgery or blood thinners for 1 week prior to surgery.  Do not eat or drink anything after midnight the night before surgery.  Let your caregiver know if you develop a cold or other infectious problem prior to surgery.  You should be present 60 minutes before the procedure or as directed. PROCEDURE  You will be given medicine that makes you sleep (general anesthetic). When you are asleep, your surgeon will make several small cuts (incisions) in your abdomen. One of these incisions is used to insert a small, lighted scope (laparoscope) into the abdomen. The laparoscope helps the surgeon see into your abdomen. Carbon dioxide gas will be pumped into your abdomen. The gas allows more room for the surgeon to perform your surgery. Other operating instruments are inserted through the other incisions. Laparoscopic procedures may not be appropriate when:  There is major scarring from previous surgery.  The gallbladder is extremely inflamed.  There are bleeding disorders or unexpected cirrhosis of the liver.  A pregnancy is near term.  Other conditions make the laparoscopic procedure impossible. If your surgeon feels it is not safe to continue with a laparoscopic procedure, he or she will perform an open abdominal procedure. In this case, the surgeon will make an incision to open the abdomen. This gives the surgeon a larger view  and field to work within. This may allow the surgeon to perform procedures that sometimes cannot be performed with a laparoscope alone. Open surgery has a longer recovery time. AFTER THE PROCEDURE  You will be taken to the recovery area where a nurse will watch and check your progress.  You may be allowed to go home the same day.  Do not resume physical activities until directed by your caregiver.  You may resume a normal diet and activities as directed. Document Released: 05/21/2005 Document Revised: 08/13/2011 Document Reviewed: 11/03/2010 [facility] Patient Information 2014 [place], [place].

## 2013-03-09 ENCOUNTER — Encounter (HOSPITAL_COMMUNITY): Payer: Self-pay

## 2013-03-09 ENCOUNTER — Encounter (HOSPITAL_COMMUNITY)
Admission: RE | Admit: 2013-03-09 | Discharge: 2013-03-09 | Disposition: A | Discharge status: Home / Self Care | Payer: Medicaid Other | Source: Ambulatory Visit | Attending: General Surgery | Admitting: General Surgery

## 2013-03-09 HISTORY — DX: Unspecified osteoarthritis, unspecified site: M19.90

## 2013-03-09 HISTORY — DX: Gastro-esophageal reflux disease without esophagitis: K21.9

## 2013-03-09 LAB — CBC WITH DIFFERENTIAL/PLATELET
Basophils Relative: 0 % (ref 0–1)
Eosinophils Absolute: 0.3 10*3/uL (ref 0.0–0.7)
HCT: 40.8 % (ref 36.0–46.0)
Hemoglobin: 13.9 g/dL (ref 12.0–15.0)
Lymphs Abs: 2.4 10*3/uL (ref 0.7–4.0)
MCH: 31.5 pg (ref 26.0–34.0)
MCHC: 34.1 g/dL (ref 30.0–36.0)
MCV: 92.5 fL (ref 78.0–100.0)
Monocytes Absolute: 0.6 10*3/uL (ref 0.1–1.0)
Monocytes Relative: 6 % (ref 3–12)
Neutro Abs: 6 10*3/uL (ref 1.7–7.7)
Neutrophils Relative %: 64 % (ref 43–77)
Platelets: 249 10*3/uL (ref 150–400)

## 2013-03-09 LAB — BASIC METABOLIC PANEL
BUN: 15 mg/dL (ref 6–23)
CO2: 26 mEq/L (ref 19–32)
Chloride: 103 mEq/L (ref 96–112)
GFR calc non Af Amer: >90 mL/min (ref >90)
Glucose, Bld: 107 mg/dL — ABNORMAL HIGH (ref 70–99)
Potassium: 4.3 mEq/L (ref 3.5–5.1)

## 2013-03-09 LAB — HEPATIC FUNCTION PANEL
AST: 25 U/L (ref 0–37)
Albumin: 4.1 g/dL (ref 3.5–5.2)
Alkaline Phosphatase: 129 U/L — ABNORMAL HIGH (ref 39–117)
Total Bilirubin: 0.3 mg/dL (ref 0.3–1.2)

## 2013-03-11 ENCOUNTER — Encounter (HOSPITAL_COMMUNITY)
Admission: RE | Disposition: A | Discharge status: Home / Self Care | Payer: Self-pay | Source: Ambulatory Visit | Attending: General Surgery

## 2013-03-11 ENCOUNTER — Ambulatory Visit (HOSPITAL_COMMUNITY): Payer: Medicaid Other | Admitting: Anesthesiology

## 2013-03-11 ENCOUNTER — Encounter (HOSPITAL_COMMUNITY): Payer: Self-pay | Admitting: *Deleted

## 2013-03-11 ENCOUNTER — Encounter (HOSPITAL_COMMUNITY): Payer: Medicaid Other | Admitting: Anesthesiology

## 2013-03-11 ENCOUNTER — Ambulatory Visit (HOSPITAL_COMMUNITY)
Admission: RE | Admit: 2013-03-11 | Discharge: 2013-03-11 | Disposition: A | Discharge status: Home / Self Care | Payer: Medicaid Other | Source: Ambulatory Visit | Attending: General Surgery | Admitting: General Surgery

## 2013-03-11 DIAGNOSIS — K811 Chronic cholecystitis: Secondary | ICD-10-CM | POA: Insufficient documentation

## 2013-03-11 HISTORY — PX: CHOLECYSTECTOMY: SHX55

## 2013-03-11 SURGERY — LAPAROSCOPIC CHOLECYSTECTOMY
Anesthesia: General | Site: Abdomen | Wound class: Clean Contaminated

## 2013-03-11 MED ORDER — SUCCINYLCHOLINE CHLORIDE 20 MG/ML IJ SOLN
INTRAMUSCULAR | Status: AC
  Filled 2013-03-11: qty 1

## 2013-03-11 MED ORDER — MIDAZOLAM HCL 2 MG/2ML IJ SOLN
INTRAMUSCULAR | Status: AC
  Filled 2013-03-11: qty 2

## 2013-03-11 MED ORDER — BUPIVACAINE HCL (PF) 0.5 % IJ SOLN
INTRAMUSCULAR | Status: DC | PRN
  Administered 2013-03-11: 10 mL

## 2013-03-11 MED ORDER — HYDROCODONE-ACETAMINOPHEN 5-325 MG PO TABS: 1.0000 | ORAL_TABLET | Freq: Four times a day (QID) | ORAL | Status: DC | PRN

## 2013-03-11 MED ORDER — FENTANYL CITRATE 0.05 MG/ML IJ SOLN
INTRAMUSCULAR | Status: AC
  Filled 2013-03-11: qty 5

## 2013-03-11 MED ORDER — ALBUTEROL SULFATE HFA 108 (90 BASE) MCG/ACT IN AERS
INHALATION_SPRAY | RESPIRATORY_TRACT | Status: AC
  Filled 2013-03-11: qty 6.7

## 2013-03-11 MED ORDER — GLYCOPYRROLATE 0.2 MG/ML IJ SOLN
INTRAMUSCULAR | Status: AC
  Filled 2013-03-11: qty 2

## 2013-03-11 MED ORDER — PROPOFOL 10 MG/ML IV EMUL
INTRAVENOUS | Status: AC
  Filled 2013-03-11: qty 20

## 2013-03-11 MED ORDER — FENTANYL CITRATE 0.05 MG/ML IJ SOLN
25.0000 ug | INTRAMUSCULAR | Status: DC | PRN
  Administered 2013-03-11 (×2): 50 ug via INTRAVENOUS

## 2013-03-11 MED ORDER — LIDOCAINE HCL (PF) 1 % IJ SOLN
INTRAMUSCULAR | Status: AC
  Filled 2013-03-11: qty 5

## 2013-03-11 MED ORDER — 0.9 % SODIUM CHLORIDE (POUR BTL) OPTIME
TOPICAL | Status: DC | PRN
  Administered 2013-03-11: 1000 mL

## 2013-03-11 MED ORDER — KETOROLAC TROMETHAMINE 30 MG/ML IJ SOLN
30.0000 mg | Freq: Once | INTRAMUSCULAR | Status: AC
  Administered 2013-03-11: 30 mg via INTRAVENOUS
  Filled 2013-03-11: qty 1

## 2013-03-11 MED ORDER — ONDANSETRON HCL 4 MG/2ML IJ SOLN: 4.0000 mg | Freq: Once | INTRAMUSCULAR | Status: DC | PRN

## 2013-03-11 MED ORDER — CEFAZOLIN SODIUM-DEXTROSE 2-3 GM-% IV SOLR
2.0000 g | INTRAVENOUS | Status: AC
  Administered 2013-03-11: 2 g via INTRAVENOUS
  Filled 2013-03-11: qty 50

## 2013-03-11 MED ORDER — DEXAMETHASONE SODIUM PHOSPHATE 4 MG/ML IJ SOLN
INTRAMUSCULAR | Status: AC
  Filled 2013-03-11: qty 1

## 2013-03-11 MED ORDER — HEMOSTATIC AGENTS (NO CHARGE) OPTIME
TOPICAL | Status: DC | PRN
  Administered 2013-03-11: 1 via TOPICAL

## 2013-03-11 MED ORDER — ONDANSETRON HCL 4 MG/2ML IJ SOLN
4.0000 mg | Freq: Once | INTRAMUSCULAR | Status: AC
  Administered 2013-03-11: 4 mg via INTRAVENOUS

## 2013-03-11 MED ORDER — ENOXAPARIN SODIUM 40 MG/0.4ML ~~LOC~~ SOLN
40.0000 mg | Freq: Once | SUBCUTANEOUS | Status: AC
  Administered 2013-03-11: 40 mg via SUBCUTANEOUS
  Filled 2013-03-11: qty 0.4

## 2013-03-11 MED ORDER — FENTANYL CITRATE 0.05 MG/ML IJ SOLN
INTRAMUSCULAR | Status: DC | PRN
  Administered 2013-03-11 (×5): 50 ug via INTRAVENOUS

## 2013-03-11 MED ORDER — PROPOFOL 10 MG/ML IV BOLUS
INTRAVENOUS | Status: DC | PRN
  Administered 2013-03-11: 20 mg via INTRAVENOUS
  Administered 2013-03-11: 30 mg via INTRAVENOUS
  Administered 2013-03-11: 150 mg via INTRAVENOUS

## 2013-03-11 MED ORDER — BUPIVACAINE HCL (PF) 0.5 % IJ SOLN
INTRAMUSCULAR | Status: AC
  Filled 2013-03-11: qty 30

## 2013-03-11 MED ORDER — GLYCOPYRROLATE 0.2 MG/ML IJ SOLN
INTRAMUSCULAR | Status: DC | PRN
  Administered 2013-03-11: 0.4 mg via INTRAVENOUS

## 2013-03-11 MED ORDER — DEXAMETHASONE SODIUM PHOSPHATE 4 MG/ML IJ SOLN
4.0000 mg | Freq: Once | INTRAMUSCULAR | Status: AC
  Administered 2013-03-11: 4 mg via INTRAVENOUS

## 2013-03-11 MED ORDER — ALBUTEROL SULFATE HFA 108 (90 BASE) MCG/ACT IN AERS
INHALATION_SPRAY | RESPIRATORY_TRACT | Status: DC | PRN
  Administered 2013-03-11 (×3): 2 via RESPIRATORY_TRACT

## 2013-03-11 MED ORDER — LACTATED RINGERS IV SOLN
INTRAVENOUS | Status: DC
  Administered 2013-03-11: 1000 mL via INTRAVENOUS

## 2013-03-11 MED ORDER — SUCCINYLCHOLINE CHLORIDE 20 MG/ML IJ SOLN
INTRAMUSCULAR | Status: DC | PRN
  Administered 2013-03-11: 140 mg via INTRAVENOUS

## 2013-03-11 MED ORDER — ROCURONIUM BROMIDE 50 MG/5ML IV SOLN
INTRAVENOUS | Status: AC
  Filled 2013-03-11: qty 1

## 2013-03-11 MED ORDER — MIDAZOLAM HCL 2 MG/2ML IJ SOLN
1.0000 mg | INTRAMUSCULAR | Status: DC | PRN
  Administered 2013-03-11: 2 mg via INTRAVENOUS

## 2013-03-11 MED ORDER — LIDOCAINE HCL (CARDIAC) 10 MG/ML IV SOLN
INTRAVENOUS | Status: DC | PRN
  Administered 2013-03-11: 20 mg via INTRAVENOUS

## 2013-03-11 MED ORDER — CHLORHEXIDINE GLUCONATE 4 % EX LIQD: 1.0000 "application " | Freq: Once | CUTANEOUS | Status: DC

## 2013-03-11 MED ORDER — FENTANYL CITRATE 0.05 MG/ML IJ SOLN
INTRAMUSCULAR | Status: AC
  Filled 2013-03-11: qty 2

## 2013-03-11 MED ORDER — NEOSTIGMINE METHYLSULFATE 1 MG/ML IJ SOLN
INTRAMUSCULAR | Status: DC | PRN
  Administered 2013-03-11: 2 mg via INTRAVENOUS

## 2013-03-11 MED ORDER — ONDANSETRON HCL 4 MG/2ML IJ SOLN
INTRAMUSCULAR | Status: AC
  Filled 2013-03-11: qty 2

## 2013-03-11 MED ORDER — ROCURONIUM BROMIDE 100 MG/10ML IV SOLN
INTRAVENOUS | Status: DC | PRN
  Administered 2013-03-11: 25 mg via INTRAVENOUS
  Administered 2013-03-11: 5 mg via INTRAVENOUS

## 2013-03-11 SURGICAL SUPPLY — 41 items
APPLIER CLIP LAPSCP 10X32 DD (CLIP) ×2 IMPLANT
BAG HAMPER (MISCELLANEOUS) ×2 IMPLANT
CLOTH BEACON ORANGE TIMEOUT ST (SAFETY) ×2 IMPLANT
COVER LIGHT HANDLE STERIS (MISCELLANEOUS) ×4 IMPLANT
DECANTER SPIKE VIAL GLASS SM (MISCELLANEOUS) ×2 IMPLANT
DURAPREP 26ML APPLICATOR (WOUND CARE) ×2 IMPLANT
ELECT REM PT RETURN 9FT ADLT (ELECTROSURGICAL) ×2
ELECTRODE REM PT RTRN 9FT ADLT (ELECTROSURGICAL) ×1 IMPLANT
FILTER SMOKE EVAC LAPAROSHD (FILTER) ×2 IMPLANT
FORMALIN 10 PREFIL 120ML (MISCELLANEOUS) ×2 IMPLANT
GLOVE BIO SURGEON STRL SZ7.5 (GLOVE) ×2 IMPLANT
GLOVE BIOGEL PI IND STRL 7.0 (GLOVE) ×2 IMPLANT
GLOVE BIOGEL PI IND STRL 7.5 (GLOVE) ×1 IMPLANT
GLOVE BIOGEL PI INDICATOR 7.0 (GLOVE) ×2
GLOVE BIOGEL PI INDICATOR 7.5 (GLOVE) ×1
GLOVE ECLIPSE 6.5 STRL STRAW (GLOVE) ×4 IMPLANT
GLOVE ECLIPSE 7.0 STRL STRAW (GLOVE) ×2 IMPLANT
GOWN STRL REIN XL XLG (GOWN DISPOSABLE) ×6 IMPLANT
HEMOSTAT SNOW SURGICEL 2X4 (HEMOSTASIS) ×2 IMPLANT
INST SET LAPROSCOPIC AP (KITS) ×2 IMPLANT
IV NS IRRIG 3000ML ARTHROMATIC (IV SOLUTION) IMPLANT
KIT ROOM TURNOVER APOR (KITS) ×2 IMPLANT
MANIFOLD NEPTUNE II (INSTRUMENTS) ×2 IMPLANT
NEEDLE INSUFFLATION 14GA 120MM (NEEDLE) ×2 IMPLANT
NS IRRIG 1000ML POUR BTL (IV SOLUTION) ×2 IMPLANT
PACK LAP CHOLE LZT030E (CUSTOM PROCEDURE TRAY) ×2 IMPLANT
PAD ARMBOARD 7.5X6 YLW CONV (MISCELLANEOUS) ×2 IMPLANT
POUCH SPECIMEN RETRIEVAL 10MM (ENDOMECHANICALS) ×2 IMPLANT
SET BASIN LINEN APH (SET/KITS/TRAYS/PACK) ×2 IMPLANT
SET TUBE IRRIG SUCTION NO TIP (IRRIGATION / IRRIGATOR) IMPLANT
SLEEVE ENDOPATH XCEL 5M (ENDOMECHANICALS) ×2 IMPLANT
SPONGE GAUZE 2X2 8PLY STRL LF (GAUZE/BANDAGES/DRESSINGS) ×2 IMPLANT
STAPLER VISISTAT (STAPLE) ×2 IMPLANT
SUT VICRYL 0 UR6 27IN ABS (SUTURE) ×2 IMPLANT
TAPE CLOTH SURG 4X10 WHT LF (GAUZE/BANDAGES/DRESSINGS) ×2 IMPLANT
TROCAR ENDO BLADELESS 11MM (ENDOMECHANICALS) ×2 IMPLANT
TROCAR XCEL NON-BLD 5MMX100MML (ENDOMECHANICALS) ×2 IMPLANT
TROCAR XCEL UNIV SLVE 11M 100M (ENDOMECHANICALS) ×2 IMPLANT
TUBING INSUFFLATION (TUBING) ×2 IMPLANT
WARMER LAPAROSCOPE (MISCELLANEOUS) ×2 IMPLANT
YANKAUER SUCT 12FT TUBE ARGYLE (SUCTIONS) ×2 IMPLANT

## 2013-03-11 NOTE — Transfer of Care (Signed)
Immediate Anesthesia Transfer of Care Note  Patient: Crystal Deleon  Procedure(s) Performed: Procedure(s) (LRB): LAPAROSCOPIC CHOLECYSTECTOMY (N/A)  Patient Location: PACU  Anesthesia Type: General  Level of Consciousness: awake  Airway & Oxygen Therapy: Patient Spontanous Breathing and non-rebreather face mask  Post-op Assessment: Report given to PACU RN, Post -op Vital signs reviewed and stable and Patient moving all extremities  Post vital signs: Reviewed and stable  Complications: No apparent anesthesia complications

## 2013-03-11 NOTE — Interval H&P Note (Signed)
History and Physical Interval Note:  03/11/2013 8:30 AM  Crystal Deleon  has presented today for surgery, with the diagnosis of chronic cholecystitis   The various methods of treatment have been discussed with the patient and family. After consideration of risks, benefits and other options for treatment, the patient has consented to  Procedure(s): LAPAROSCOPIC CHOLECYSTECTOMY (N/A) as a surgical intervention .  The patient's history has been reviewed, patient examined, no change in status, stable for surgery.  I have reviewed the patient's chart and labs.  Questions were answered to the patient's satisfaction.     Franky Macho A

## 2013-03-11 NOTE — Anesthesia Procedure Notes (Signed)
Procedure Name: Intubation Date/Time: 03/11/2013 8:46 AM Performed by: Franco Nones Pre-anesthesia Checklist: Patient identified, Patient being monitored, Timeout performed, Emergency Drugs available and Suction available Patient Re-evaluated:Patient Re-evaluated prior to inductionOxygen Delivery Method: Circle System Utilized Preoxygenation: Pre-oxygenation with 100% oxygen Intubation Type: IV induction, Rapid sequence and Cricoid Pressure applied Grade View: Grade I Tube type: Oral Tube size: 7.0 mm Number of attempts: 1 Airway Equipment and Method: stylet,  Video-laryngoscopy and Stylet Placement Confirmation: ETT inserted through vocal cords under direct vision,  positive ETCO2 and breath sounds checked- equal and bilateral Secured at: 20 cm Tube secured with: Tape Dental Injury: Teeth and Oropharynx as per pre-operative assessment

## 2013-03-11 NOTE — Anesthesia Preprocedure Evaluation (Signed)
Anesthesia Evaluation  Patient identified by MRN, date of birth, ID band Patient awake    Reviewed: Allergy & Precautions, H&P , NPO status , Patient's Chart, lab work & pertinent test results  Airway Mallampati: III TM Distance: <3 FB     Dental  (+) Edentulous Upper and Partial Lower   Pulmonary Current Smoker (am cough),  breath sounds clear to auscultation        Cardiovascular negative cardio ROS  Rhythm:Regular Rate:Normal     Neuro/Psych PSYCHIATRIC DISORDERS Depression    GI/Hepatic GERD-  Medicated and Controlled,  Endo/Other    Renal/GU      Musculoskeletal   Abdominal   Peds  Hematology   Anesthesia Other Findings   Reproductive/Obstetrics                           Anesthesia Physical Anesthesia Plan  ASA: II  Anesthesia Plan: General   Post-op Pain Management:    Induction: Intravenous, Rapid sequence and Cricoid pressure planned  Airway Management Planned: Oral ETT and Video Laryngoscope Planned  Additional Equipment:   Intra-op Plan:   Post-operative Plan: Extubation in OR  Informed Consent: I have reviewed the patients History and Physical, chart, labs and discussed the procedure including the risks, benefits and alternatives for the proposed anesthesia with the patient or authorized representative who has indicated his/her understanding and acceptance.     Plan Discussed with:   Anesthesia Plan Comments:         Anesthesia Quick Evaluation

## 2013-03-11 NOTE — Anesthesia Postprocedure Evaluation (Signed)
Anesthesia Post Note  Patient: Crystal Deleon  Procedure(s) Performed: Procedure(s) (LRB): LAPAROSCOPIC CHOLECYSTECTOMY (N/A)  Anesthesia type: General  Patient location: PACU  Post pain: Pain level controlled  Post assessment: Post-op Vital signs reviewed, Patient's Cardiovascular Status Stable, Respiratory Function Stable, Patent Airway, No signs of Nausea or vomiting and Pain level controlled  Last Vitals:  Filed Vitals:   03/11/13 0936  BP: 136/52  Pulse: 73  Temp: 36.7 C  Resp: 15    Post vital signs: Reviewed and stable  Level of consciousness: awake and alert   Complications: No apparent anesthesia complications

## 2013-03-11 NOTE — Op Note (Signed)
Patient:  Crystal Deleon  DOB:  February 19, 1961  MRN:  [id number]   Preop Diagnosis:  Chronic cholecystitis  Postop Diagnosis:  Same  Procedure:  Laparoscopic cholecystectomy  Surgeon:  Franky Macho, M.D.  Anes:  General endotracheal  Indications:  Patient is a 52 year old white female presents with biliary colic secondary to chronic cholecystitis. The risks and benefits of the procedure including bleeding, infection, hepatobiliary injury, and the possibility of an open procedure were fully explained to the patient, who gave informed consent.  Procedure note:  The patient was placed in the supine position. After induction of general endotracheal anesthesia, the abdomen was prepped and draped using usual sterile technique with DuraPrep. Surgical site confirmation was performed.  A supraumbilical incision was made down to the fascia. A Veress needle was introduced into the abdominal cavity and confirmation of placement was done using the saline drop test. The abdomen was then insufflated to 16 mm mercury pressure. An 11 mm trocar was introduced into the abdominal cavity under direct visualization without difficulty. The patient was placed in reverse Trendelenburg position and additional 11 mm trocar was placed the epigastric region. 5 mm trochars were placed in the right upper quadrant right flank regions. The liver was inspected and noted to be somewhat fatty in nature. The gallbladder was retracted in a dynamic fashion in order to expose the triangle of Calot. The cystic duct was first identified. Its juncture to the infundibulum was fully identified. Endoclips placed proximally distally on the cystic duct, and the cystic duct was divided. This was likewise done to the cystic artery. The gallbladder was then freed away from the gallbladder fossa using Bovie electrocautery. The gallbladder was delivered through the epigastric trocar site using an Endo Catch bag. The gallbladder fossa was inspected  and no abnormal bleeding or bile leakage was noted. Surgicel is placed the gallbladder fossa. All fluid and air were then evacuated from the abdominal cavity prior to removal of the trochars.  All wounds were irrigated with normal saline. All wounds were injected with 0.5% Sensorcaine. The supraumbilical fascia as well as epigastric fascia were reapproximated using 0 Vicryl interrupted sutures. All skin incisions were closed using staples. Betadine ointment and dry sterile dressings were applied.  All tape and needle counts were correct at the end of the procedure. The patient was extubated in the operating room and transferred to PACU in stable condition.  Complications:  None  EBL:  Minimal  Specimen:  Gallbladder

## 2013-03-13 ENCOUNTER — Encounter (HOSPITAL_COMMUNITY): Payer: Self-pay | Admitting: General Surgery

## 2013-04-28 ENCOUNTER — Ambulatory Visit (INDEPENDENT_AMBULATORY_CARE_PROVIDER_SITE_OTHER): Payer: Medicaid Other | Admitting: Internal Medicine

## 2013-04-28 ENCOUNTER — Encounter: Payer: Self-pay | Admitting: Internal Medicine

## 2013-04-28 ENCOUNTER — Encounter (INDEPENDENT_AMBULATORY_CARE_PROVIDER_SITE_OTHER): Payer: Self-pay

## 2013-04-28 VITALS — BP 135/81 | HR 77 | Temp 97.2°F | Ht 64.0 in | Wt 200.0 lb

## 2013-04-28 DIAGNOSIS — K760 Fatty (change of) liver, not elsewhere classified: Secondary | ICD-10-CM

## 2013-04-28 DIAGNOSIS — R1011 Right upper quadrant pain: Secondary | ICD-10-CM

## 2013-04-28 DIAGNOSIS — K7689 Other specified diseases of liver: Secondary | ICD-10-CM

## 2013-04-28 DIAGNOSIS — K5900 Constipation, unspecified: Secondary | ICD-10-CM

## 2013-04-28 NOTE — Patient Instructions (Signed)
Hepatic profile, TSH and lipase today.  Weight loss encouraged  Regular coffee intake may help your liver  Further recommendations to follow  You may need to go to a pain

## 2013-04-28 NOTE — Progress Notes (Signed)
Primary Care Physician:  Kirstie Peri, MD Primary Gastroenterologist:  Dr. Jena Gauss  Pre-Procedure History & Physical: HPI:  Crystal Deleon is a 52 y.o. female here for followup of right upper quadrant abdominal pain. Ultimately, saw Dr. Lovell Sheehan for chronic cholecystitis. Gallbladder remains. This did not help her pain. She has a postprandial right upper quadrant abdominal pain also has a tingling numbness numbing sensation in the area of her right upper quadrant. Chronically constipated but well managed on MiraLax. Reflux symptoms well controlled on Protonix.  History of a mildly elevated alkaline phosphatase. CT scan few months ago demonstrate a fatty liver but really nothing else that would be relevant to her abdominal pain. She's gained a significant amount of weight. Of note Lidoderm patches did not help prior to cholecystectomy.   The non-H. pylori erosive gastritis on prior EGD earlier this year.  Takes NSAIDs daily with concomitant acid suppression therapy  Complains of fatigue and no energy. Trying to diet and lose weight but not successful.  Past Medical History  Diagnosis Date  . Depression   . Hx of adenomatous colonic polyps 2013  . Arthritis   . GERD (gastroesophageal reflux disease)     Past Surgical History  Procedure Laterality Date  . Colonoscopy  09/2011    Dr. Adah Perl diverticulosis in sigmoid colon. Multiple tubular adenomas removed (4). Recommended to have next TCS in 09/2014   . Esophagogastroduodenoscopy N/A 10/06/2012    ZOX:WRUE erosive reflux esophagitis/Gastric and bulbar erosions of uncertain significance s/p gastric biopsy, path negative for H.pylori  . Cholecystectomy N/A 03/11/2013    Dr. Lovell Sheehan    Prior to Admission medications   Medication Sig Start Date End Date Taking? Authorizing Provider  HYDROcodone-acetaminophen (NORCO/VICODIN) 5-325 MG per tablet Take 1-2 tablets by mouth every 6 (six) hours as needed for pain. 03/11/13  Yes Dalia Heading, MD   naproxen (NAPROSYN) 500 MG tablet Take 500 mg by mouth 2 (two) times daily with a meal.   Yes Historical Provider, MD  polyethylene glycol powder (MIRALAX) powder Take 17 g by mouth at bedtime as needed (constipation). Take 1 capful twice a day for 3 days, then take 1 capful at bedtime as needed. 09/18/12  Yes Tiffany Kocher, PA-C    Allergies as of 04/28/2013  . (No Known Allergies)    Family History  Problem Relation Age of Onset  . Colon cancer Maternal Uncle     greater than age 99  . Liver disease Neg Hx   . Diabetes Mother   . Diabetes Father     History   Social History  . Marital Status: Married    Spouse Name: N/A    Number of Children: 5  . Years of Education: N/A   Occupational History  . Not on file.   Social History Main Topics  . Smoking status: Current Every Day Smoker -- 1.00 packs/day for 34 years    Types: Cigarettes  . Smokeless tobacco: Not on file  . Alcohol Use: No  . Drug Use: No  . Sexual Activity: Not on file   Other Topics Concern  . Not on file   Social History Narrative  . No narrative on file    Review of Systems: See HPI, otherwise negative ROS  Physical Exam: BP 135/81  Pulse 77  Temp(Src) 97.2 F (36.2 C) (Oral)  Ht 5\' 4"  (1.626 m)  Wt 200 lb (90.719 kg)  BMI 34.31 kg/m2 General:   Obese. Alert,  Well-developed, well-nourished, pleasant and  cooperative in NAD Skin:  Intact without significant lesions or rashes. Eyes:  Sclera clear, no icterus.   Conjunctiva pink. Ears:  Normal auditory acuity. Nose:  No deformity, discharge,  or lesions. Mouth:  No deformity or lesions. Neck:  Supple; no masses or thyromegaly. No significant cervical adenopathy. Lungs:  Clear throughout to auscultation.   No wheezes, crackles, or rhonchi. No acute distress. Heart:  Regular rate and rhythm; no murmurs, clicks, rubs,  or gallops. Abdomen: Obese. Non-distended, normal bowel sounds.  Soft and nontender without appreciable mass or  hepatosplenomegaly.  Pulses:  Normal pulses noted. Extremities:  Without clubbing or edema.  Impression:  52 year old lady with chronic right upper quadrant abdominal pain. Symptoms unchanged after cholecystectomy. No evidence of biliary dilation or significant bump in LFTs. Likely has underlying fatty liver and metabolic syndrome. She does have a neuropathic component to her pain.  Her thyroid functions need to be checked. Elevated alkaline phosphatase is nonspecific.  Constipation fairly well managed.  Recommendations:   Weight loss encouraged. I suggest she see her PCP for assistance. Regular coffee intake may help to your liver. Repeat LFTs, serum lipase, TSH. We'll hold off on drawing an AMA to see what her alkaline phosphatase is doing. I explained to her she may ultimately end up seeing a pain management specialist.  History of colonic polyps with surveillance plan per Dr. Teena Dunk.

## 2013-04-29 LAB — HEPATIC FUNCTION PANEL
ALT: 29 U/L (ref 0–35)
Albumin: 4.5 g/dL (ref 3.5–5.2)
Alkaline Phosphatase: 117 U/L (ref 39–117)
Bilirubin, Direct: 0.1 mg/dL (ref 0.0–0.3)
Total Bilirubin: 0.4 mg/dL (ref 0.3–1.2)
Total Protein: 6.7 g/dL (ref 6.0–8.3)

## 2013-04-29 LAB — TSH: TSH: 1.693 u[IU]/mL (ref 0.350–4.500)

## 2013-04-29 LAB — LIPASE: Lipase: 13 U/L (ref 0–75)

## 2013-05-05 ENCOUNTER — Other Ambulatory Visit: Payer: Self-pay | Admitting: Internal Medicine

## 2013-05-05 ENCOUNTER — Telehealth: Payer: Self-pay

## 2013-05-05 DIAGNOSIS — R1011 Right upper quadrant pain: Secondary | ICD-10-CM

## 2013-05-05 DIAGNOSIS — R52 Pain, unspecified: Secondary | ICD-10-CM

## 2013-05-05 NOTE — Telephone Encounter (Signed)
I feel getting her a second opinion over at [facility] in their GI clinic is an excellent idea. Let's get the ball rolling in that direction.

## 2013-05-05 NOTE — Telephone Encounter (Signed)
Pt is aware. Benedetto Goad, please send referral.

## 2013-05-05 NOTE — Telephone Encounter (Signed)
Called pt with blood work results. She said she is still having a lot of pain on her R side and she doesn't think pain management is going to help her. She said the pain is worse when she eats and when she is on her feet for a long time. She wants to know what is causing the pain and said if we needed to refer her somewhere like [place], that was ok and she would go. Please advise.

## 2013-05-06 NOTE — Progress Notes (Signed)
Patient is aware 

## 2013-05-06 NOTE — Telephone Encounter (Signed)
Patient is scheduled with Dr. Idelle Jo at [facility] on Wednesday February 4th at 2:00 and I have LMOM for [name] to return my call to confirm

## 2013-05-06 NOTE — Telephone Encounter (Signed)
Patient is aware 

## 2013-05-19 NOTE — Telephone Encounter (Signed)
I had to turn the referral for Pain Management to Dr. Gerilyn Pilgrim over to Mrs. Andersons PCP for referral due to [facility] Medicaid

## 2013-09-09 ENCOUNTER — Encounter: Payer: Self-pay | Admitting: Internal Medicine

## 2013-10-19 ENCOUNTER — Other Ambulatory Visit: Payer: Self-pay

## 2013-10-19 DIAGNOSIS — R1011 Right upper quadrant pain: Secondary | ICD-10-CM

## 2013-12-14 ENCOUNTER — Other Ambulatory Visit (HOSPITAL_COMMUNITY): Payer: Self-pay | Admitting: Orthopaedic Surgery

## 2013-12-14 DIAGNOSIS — M25561 Pain in right knee: Secondary | ICD-10-CM

## 2013-12-16 ENCOUNTER — Other Ambulatory Visit (HOSPITAL_COMMUNITY): Payer: Self-pay | Admitting: Orthopaedic Surgery

## 2013-12-16 ENCOUNTER — Ambulatory Visit (HOSPITAL_COMMUNITY)
Admission: RE | Admit: 2013-12-16 | Discharge: 2013-12-16 | Disposition: A | Discharge status: Home / Self Care | Payer: Medicaid Other | Source: Ambulatory Visit | Attending: Orthopaedic Surgery | Admitting: Orthopaedic Surgery

## 2013-12-16 DIAGNOSIS — M25569 Pain in unspecified knee: Secondary | ICD-10-CM | POA: Insufficient documentation

## 2013-12-16 DIAGNOSIS — G8929 Other chronic pain: Secondary | ICD-10-CM | POA: Diagnosis not present

## 2013-12-16 DIAGNOSIS — M948X9 Other specified disorders of cartilage, unspecified sites: Secondary | ICD-10-CM | POA: Insufficient documentation

## 2013-12-16 DIAGNOSIS — M25469 Effusion, unspecified knee: Secondary | ICD-10-CM | POA: Insufficient documentation

## 2013-12-16 DIAGNOSIS — M25561 Pain in right knee: Secondary | ICD-10-CM

## 2014-04-10 IMAGING — NM NM HEPATO W/GB/PHARM/[PERSON_NAME]
2 series · 12 of 12 positions shown · non-contrast
Comparison: 10/20/2011

CLINICAL DATA: Right upper quadrant pain.

NUCLEAR MEDICINE HEPATOBILIARY IMAGING WITH GALLBLADDER EF
TECHNIQUE: Sequential images of the abdomen were obtained [DATE] minutes following intravenous administration of
radiopharmaceutical.  After slow intravenous infusion of
micrograms Cholecystokinin, gallbladder ejection fraction was
determined.
Radiopharmaceutical:  5.0 mCi Tc-TTm Choletec

[hida · 3.20mm/px · 6 of 60 frames shown (1 of 2)]
[frame 6/60]
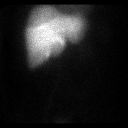
[frame 16/60]
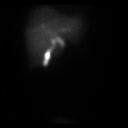
[frame 26/60]
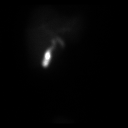
[frame 36/60]
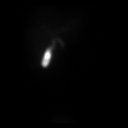
[frame 46/60]
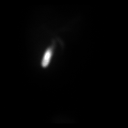
[frame 56/60]
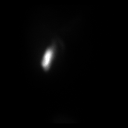

[hida · 3.20mm/px · 6 of 30 frames shown (2 of 2)]
[frame 3/30]
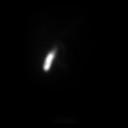
[frame 8/30]
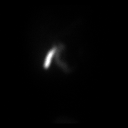
[frame 13/30]
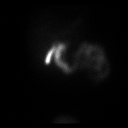
[frame 18/30]
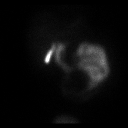
[frame 23/30]
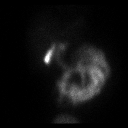
[frame 28/30]
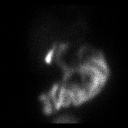

[12 of 12 positions shown; findings below may reference images not displayed]

FINDINGS: Homogeneous uptake by the liver.  Gallbladder visualized
by 10 minutes.  Small bowel not visualized until injection of CCK.

After injection of CCK, gallbladder ejection fraction 92.9% (normal
value 30% or greater)

The patient did not experience symptoms during CCK infusion.
IMPRESSION: Normal exam.

## 2014-09-24 ENCOUNTER — Other Ambulatory Visit (HOSPITAL_COMMUNITY): Payer: Self-pay | Admitting: Orthopaedic Surgery

## 2014-09-24 DIAGNOSIS — M25561 Pain in right knee: Secondary | ICD-10-CM

## 2014-09-29 ENCOUNTER — Ambulatory Visit (HOSPITAL_COMMUNITY)
Admission: RE | Admit: 2014-09-29 | Discharge: 2014-09-29 | Disposition: A | Discharge status: Home / Self Care | Payer: Medicaid Other | Source: Ambulatory Visit | Attending: Orthopaedic Surgery | Admitting: Orthopaedic Surgery

## 2014-09-29 DIAGNOSIS — M25561 Pain in right knee: Secondary | ICD-10-CM | POA: Diagnosis not present

## 2014-12-03 ENCOUNTER — Encounter (HOSPITAL_COMMUNITY)
Admission: RE | Admit: 2014-12-03 | Discharge: 2014-12-03 | Disposition: A | Discharge status: Home / Self Care | Payer: Medicaid Other | Source: Ambulatory Visit | Attending: Orthopaedic Surgery | Admitting: Orthopaedic Surgery

## 2014-12-03 ENCOUNTER — Other Ambulatory Visit: Payer: Self-pay

## 2014-12-03 ENCOUNTER — Other Ambulatory Visit: Payer: Self-pay | Admitting: Radiology

## 2014-12-03 ENCOUNTER — Encounter (HOSPITAL_COMMUNITY): Payer: Self-pay

## 2014-12-03 DIAGNOSIS — Z01818 Encounter for other preprocedural examination: Secondary | ICD-10-CM | POA: Diagnosis present

## 2014-12-03 LAB — COMPREHENSIVE METABOLIC PANEL
ALT: 20 U/L (ref 14–54)
AST: 21 U/L (ref 15–41)
Albumin: 4.4 g/dL (ref 3.5–5.0)
Alkaline Phosphatase: 130 U/L — ABNORMAL HIGH (ref 38–126)
Anion gap: 9 (ref 5–15)
BILIRUBIN TOTAL: 0.6 mg/dL (ref 0.3–1.2)
BUN: 11 mg/dL (ref 6–20)
CO2: 26 mmol/L (ref 22–32)
Calcium: 9.4 mg/dL (ref 8.9–10.3)
Chloride: 102 mmol/L (ref 101–111)
Creatinine, Ser: 0.56 mg/dL (ref 0.44–1.00)
GFR calc non Af Amer: >60 mL/min (ref >60)
GLUCOSE: 81 mg/dL (ref 65–99)
POTASSIUM: 4.6 mmol/L (ref 3.5–5.1)
Sodium: 137 mmol/L (ref 135–145)
Total Protein: 7.1 g/dL (ref 6.5–8.1)

## 2014-12-03 LAB — CBC WITH DIFFERENTIAL/PLATELET
BASOS ABS: 0.1 10*3/uL (ref 0.0–0.1)
Basophils Relative: 0 % (ref 0–1)
EOS PCT: 2 % (ref 0–5)
Eosinophils Absolute: 0.2 10*3/uL (ref 0.0–0.7)
HEMATOCRIT: 44.6 % (ref 36.0–46.0)
Hemoglobin: 15.2 g/dL — ABNORMAL HIGH (ref 12.0–15.0)
Lymphocytes Relative: 29 % (ref 12–46)
Lymphs Abs: 3.7 10*3/uL (ref 0.7–4.0)
MCH: 31.7 pg (ref 26.0–34.0)
MCHC: 34.1 g/dL (ref 30.0–36.0)
MCV: 93.1 fL (ref 78.0–100.0)
Monocytes Absolute: 0.8 10*3/uL (ref 0.1–1.0)
Monocytes Relative: 6 % (ref 3–12)
Neutro Abs: 8 10*3/uL — ABNORMAL HIGH (ref 1.7–7.7)
Neutrophils Relative %: 63 % (ref 43–77)
Platelets: 268 10*3/uL (ref 150–400)
RBC: 4.79 MIL/uL (ref 3.87–5.11)
RDW: 12.5 % (ref 11.5–15.5)
WBC: 12.7 10*3/uL — ABNORMAL HIGH (ref 4.0–10.5)

## 2014-12-03 LAB — URINALYSIS, ROUTINE W REFLEX MICROSCOPIC
Bilirubin Urine: NEGATIVE
GLUCOSE, UA: NEGATIVE mg/dL
HGB URINE DIPSTICK: NEGATIVE
Ketones, ur: NEGATIVE mg/dL
Leukocytes, UA: NEGATIVE
NITRITE: NEGATIVE
Protein, ur: NEGATIVE mg/dL
SPECIFIC GRAVITY, URINE: 1.025 (ref 1.005–1.030)
UROBILINOGEN UA: 0.2 mg/dL (ref 0.0–1.0)
pH: 5.5 (ref 5.0–8.0)

## 2014-12-03 NOTE — H&P (Addendum)
Crystal AversKimberly C Deleon is an 54 y.o. female.   Chief Complaint: Right knee pain HPI: She has had pain and tenderness of the right knee for several months.  She initially had swelling and popping of the left knee. It got worse and she developed giving way.  She has been on exercises and NSAIDs.  MRI done 09/29/14 shows tear of the medial meniscus.  I have recommended arthroscopy of the right knee.  She has elected to postpone the procedure until now.  I have gone over the risks and imponderables of the procedure.  She accepts and appears to understand.  Past Medical History  Diagnosis Date  . Depression   . Hx of adenomatous colonic polyps 2013  . Arthritis   . GERD (gastroesophageal reflux disease)     Past Surgical History  Procedure Laterality Date  . Colonoscopy  09/2011    Dr. Adah PerlBenson-->mild diverticulosis in sigmoid colon. Multiple tubular adenomas removed (4). Recommended to have next TCS in 09/2014   . Esophagogastroduodenoscopy N/A 10/06/2012    BJY:NWGNRMR:Mild erosive reflux esophagitis/Gastric and bulbar erosions of uncertain significance s/p gastric biopsy, path negative for H.pylori  . Cholecystectomy N/A 03/11/2013    Dr. Lovell SheehanJenkins    Family History  Problem Relation Age of Onset  . Colon cancer Maternal Uncle     greater than age 52  . Liver disease Neg Hx   . Diabetes Mother   . Diabetes Father    Social History:  reports that she has been smoking Cigarettes.  She has a 34 pack-year smoking history. She does not have any smokeless tobacco history on file. She reports that she does not drink alcohol or use illicit drugs.  Allergies: No Known Allergies  No prescriptions prior to admission    No results found for this or any previous visit (from the past 48 hour(s)). No results found.  Review of Systems  Gastrointestinal: Positive for heartburn.  Musculoskeletal: Positive for joint pain (Left knee for many months with swelling, pain and giving way.).  Correction:  It is the RIGHT  knee not the left knee.  Notation made 12/07/14 prior to surgery.  There were no vitals taken for this visit. Physical Exam  Constitutional: She is oriented to person, place, and time. She appears well-developed and well-nourished.  HENT:  Head: Normocephalic and atraumatic.  Eyes: Conjunctivae and EOM are normal. Pupils are equal, round, and reactive to light.  Neck: Normal range of motion. Neck supple.  Cardiovascular: Normal rate, regular rhythm, normal heart sounds and intact distal pulses.   Respiratory: Effort normal.  GI: Soft.  Musculoskeletal: She exhibits tenderness (Pain left knee with effusion, medial joint line tenderness, positive medial McMurray and ROM 0 to 105, limp to the left).  Neurological: She is alert and oriented to person, place, and time. She has normal reflexes.  Skin: Skin is warm and dry.  Psychiatric: She has a normal mood and affect. Her behavior is normal. Judgment and thought content normal.   CORRECTION;  I listed left knee above, it is not correct.  The RIGHT knee is the problem joint and this is corrected prior to surgery on 7.5.16.  Assessment/Plan Tear of the right knee medial meniscus.  For elective outpatient arthroscopy of the knee.  Verlin Duke 12/03/2014, 11:17 AM

## 2014-12-03 NOTE — Patient Instructions (Signed)
Crystal Deleon  12/03/2014     @PREFPERIOPPHARMACY @   Your procedure is scheduled on 12/07/2014.  [facility]  Call this number if you have problems the morning of surgery:  [phone]   Remember:  Do not eat food or drink liquids after midnight.  Take these medicines the morning of surgery with A SIP OF WATER:  Norco   Do not wear jewelry, make-up or nail polish.  Do not wear lotions, powders, or perfumes.  You may wear deodorant.  Do not shave 48 hours prior to surgery.  Men may shave face and neck.  Do not bring valuables to the hospital.  [facility] is not responsible for any belongings or valuables.  Contacts, dentures or bridgework may not be worn into surgery.  Leave your suitcase in the car.  After surgery it may be brought to your room.  For patients admitted to the hospital, discharge time will be determined by your treatment team.  Patients discharged the day of surgery will not be allowed to drive home.   Name and phone number of your driver:   family Special instructions:  n/a  Please read over the following fact sheets that you were given. Care and Recovery After Surgery     Arthroscopic Procedure, Knee An arthroscopic procedure can find what is wrong with your knee. PROCEDURE Arthroscopy is a surgical technique that allows your orthopedic surgeon to diagnose and treat your knee injury with accuracy. They will look into your knee through a small instrument. This is almost like a small (pencil sized) telescope. Because arthroscopy affects your knee less than open knee surgery, you can anticipate a more rapid recovery. Taking an active role by following your caregiver's instructions will help with rapid and complete recovery. Use crutches, rest, elevation, ice, and knee exercises as instructed. The length of recovery depends on various factors including type of injury, age, physical condition, medical conditions, and your  rehabilitation. Your knee is the joint between the large bones (femur and tibia) in your leg. Cartilage covers these bone ends which are smooth and slippery and allow your knee to bend and move smoothly. Two menisci, thick, semi-lunar shaped pads of cartilage which form a rim inside the joint, help absorb shock and stabilize your knee. Ligaments bind the bones together and support your knee joint. Muscles move the joint, help support your knee, and take stress off the joint itself. Because of this all programs and physical therapy to rehabilitate an injured or repaired knee require rebuilding and strengthening your muscles. AFTER THE PROCEDURE  After the procedure, you will be moved to a recovery area until most of the effects of the medication have worn off. Your caregiver will discuss the test results with you.  Only take over-the-counter or prescription medicines for pain, discomfort, or fever as directed by your caregiver. SEEK MEDICAL CARE IF:   You have increased bleeding from your wounds.  You see redness, swelling, or have increasing pain in your wounds.  You have pus coming from your wound.  You have an oral temperature above 102 F (38.9 C).  You notice a bad smell coming from the wound or dressing.  You have severe pain with any motion of your knee. SEEK IMMEDIATE MEDICAL CARE IF:   You develop a rash.  You have difficulty breathing.  You have any allergic problems. Document Released: 05/18/2000 Document Revised: 08/13/2011 Document Reviewed: 12/10/2007 [facility] Patient Information 2015 [employer] [place]. This information  is not intended to replace advice given to you by your health care provider. Make sure you discuss any questions you have with your health care provider.

## 2014-12-07 ENCOUNTER — Encounter (HOSPITAL_COMMUNITY)
Admission: RE | Disposition: A | Discharge status: Home / Self Care | Payer: Self-pay | Source: Ambulatory Visit | Attending: Orthopaedic Surgery

## 2014-12-07 ENCOUNTER — Ambulatory Visit (HOSPITAL_COMMUNITY): Payer: Medicaid Other | Admitting: Anesthesiology

## 2014-12-07 ENCOUNTER — Encounter (HOSPITAL_COMMUNITY): Payer: Self-pay | Admitting: *Deleted

## 2014-12-07 ENCOUNTER — Ambulatory Visit (HOSPITAL_COMMUNITY)
Admission: RE | Admit: 2014-12-07 | Discharge: 2014-12-07 | Disposition: A | Discharge status: Home / Self Care | Payer: Medicaid Other | Source: Ambulatory Visit | Attending: Orthopaedic Surgery | Admitting: Orthopaedic Surgery

## 2014-12-07 DIAGNOSIS — M1990 Unspecified osteoarthritis, unspecified site: Secondary | ICD-10-CM | POA: Diagnosis not present

## 2014-12-07 DIAGNOSIS — Y939 Activity, unspecified: Secondary | ICD-10-CM | POA: Insufficient documentation

## 2014-12-07 DIAGNOSIS — F17210 Nicotine dependence, cigarettes, uncomplicated: Secondary | ICD-10-CM | POA: Diagnosis not present

## 2014-12-07 DIAGNOSIS — Y929 Unspecified place or not applicable: Secondary | ICD-10-CM | POA: Insufficient documentation

## 2014-12-07 DIAGNOSIS — Y999 Unspecified external cause status: Secondary | ICD-10-CM | POA: Insufficient documentation

## 2014-12-07 DIAGNOSIS — M25561 Pain in right knee: Secondary | ICD-10-CM | POA: Diagnosis present

## 2014-12-07 DIAGNOSIS — K219 Gastro-esophageal reflux disease without esophagitis: Secondary | ICD-10-CM | POA: Insufficient documentation

## 2014-12-07 DIAGNOSIS — X58XXXA Exposure to other specified factors, initial encounter: Secondary | ICD-10-CM | POA: Diagnosis not present

## 2014-12-07 DIAGNOSIS — S83241A Other tear of medial meniscus, current injury, right knee, initial encounter: Secondary | ICD-10-CM | POA: Insufficient documentation

## 2014-12-07 HISTORY — PX: KNEE ARTHROSCOPY WITH MEDIAL MENISECTOMY: SHX5651

## 2014-12-07 SURGERY — ARTHROSCOPY, KNEE, WITH MEDIAL MENISCECTOMY
Anesthesia: General | Site: Knee | Laterality: Right

## 2014-12-07 MED ORDER — BUPIVACAINE HCL (PF) 0.25 % IJ SOLN
INTRAMUSCULAR | Status: AC
  Filled 2014-12-07: qty 30

## 2014-12-07 MED ORDER — ONDANSETRON HCL 4 MG/2ML IJ SOLN: 4.0000 mg | Freq: Once | INTRAMUSCULAR | Status: DC | PRN

## 2014-12-07 MED ORDER — HYDROCODONE-ACETAMINOPHEN 7.5-325 MG PO TABS: 1.0000 | ORAL_TABLET | ORAL | Status: DC | PRN

## 2014-12-07 MED ORDER — LACTATED RINGERS IR SOLN
Status: DC | PRN
  Administered 2014-12-07 (×2): 3000 mL

## 2014-12-07 MED ORDER — SODIUM CHLORIDE 0.9 % IR SOLN
Status: DC | PRN
  Administered 2014-12-07: 1000 mL

## 2014-12-07 MED ORDER — LIDOCAINE HCL (CARDIAC) 10 MG/ML IV SOLN
INTRAVENOUS | Status: DC | PRN
  Administered 2014-12-07: 100 mg via INTRAVENOUS

## 2014-12-07 MED ORDER — FENTANYL CITRATE (PF) 100 MCG/2ML IJ SOLN
25.0000 ug | INTRAMUSCULAR | Status: AC
  Administered 2014-12-07 (×2): 25 ug via INTRAVENOUS

## 2014-12-07 MED ORDER — DEXAMETHASONE SODIUM PHOSPHATE 4 MG/ML IJ SOLN
4.0000 mg | Freq: Once | INTRAMUSCULAR | Status: AC
  Administered 2014-12-07: 4 mg via INTRAVENOUS

## 2014-12-07 MED ORDER — FENTANYL CITRATE (PF) 100 MCG/2ML IJ SOLN
INTRAMUSCULAR | Status: AC
  Filled 2014-12-07: qty 2

## 2014-12-07 MED ORDER — FENTANYL CITRATE (PF) 100 MCG/2ML IJ SOLN
INTRAMUSCULAR | Status: DC | PRN
  Administered 2014-12-07 (×4): 25 ug via INTRAVENOUS

## 2014-12-07 MED ORDER — MIDAZOLAM HCL 2 MG/2ML IJ SOLN
1.0000 mg | INTRAMUSCULAR | Status: DC | PRN
  Administered 2014-12-07: 2 mg via INTRAVENOUS

## 2014-12-07 MED ORDER — ONDANSETRON HCL 4 MG/2ML IJ SOLN
4.0000 mg | Freq: Once | INTRAMUSCULAR | Status: AC
  Administered 2014-12-07: 4 mg via INTRAVENOUS

## 2014-12-07 MED ORDER — DEXAMETHASONE SODIUM PHOSPHATE 4 MG/ML IJ SOLN
INTRAMUSCULAR | Status: AC
  Filled 2014-12-07: qty 1

## 2014-12-07 MED ORDER — ONDANSETRON HCL 4 MG/2ML IJ SOLN
INTRAMUSCULAR | Status: AC
  Filled 2014-12-07: qty 2

## 2014-12-07 MED ORDER — PROPOFOL 10 MG/ML IV BOLUS
INTRAVENOUS | Status: DC | PRN
  Administered 2014-12-07: 150 mg via INTRAVENOUS

## 2014-12-07 MED ORDER — LACTATED RINGERS IV SOLN
INTRAVENOUS | Status: DC
  Administered 2014-12-07: 10:00:00 via INTRAVENOUS

## 2014-12-07 MED ORDER — MIDAZOLAM HCL 2 MG/2ML IJ SOLN
INTRAMUSCULAR | Status: AC
  Filled 2014-12-07: qty 2

## 2014-12-07 MED ORDER — PROPOFOL 10 MG/ML IV BOLUS
INTRAVENOUS | Status: AC
  Filled 2014-12-07: qty 20

## 2014-12-07 MED ORDER — BUPIVACAINE HCL (PF) 0.25 % IJ SOLN
INTRAMUSCULAR | Status: DC | PRN
  Administered 2014-12-07: 30 mL

## 2014-12-07 MED ORDER — CHLORHEXIDINE GLUCONATE 4 % EX LIQD: 60.0000 mL | Freq: Once | CUTANEOUS | Status: DC

## 2014-12-07 MED ORDER — FENTANYL CITRATE (PF) 100 MCG/2ML IJ SOLN
25.0000 ug | INTRAMUSCULAR | Status: DC | PRN
  Administered 2014-12-07 (×2): 25 ug via INTRAVENOUS

## 2014-12-07 SURGICAL SUPPLY — 55 items
BAG HAMPER (MISCELLANEOUS) ×4 IMPLANT
BANDAGE ELASTIC 6 VELCRO ST LF (GAUZE/BANDAGES/DRESSINGS) ×4 IMPLANT
BANDAGE ESMARK 4X12 BL STRL LF (DISPOSABLE) ×2 IMPLANT
BLADE SURG 15 STRL LF DISP TIS (BLADE) ×2 IMPLANT
BLADE SURG 15 STRL SS (BLADE) ×2
BLADE SURG SZ11 CARB STEEL (BLADE) ×4 IMPLANT
BNDG ESMARK 4X12 BLUE STRL LF (DISPOSABLE) ×4
CLOTH BEACON ORANGE TIMEOUT ST (SAFETY) ×4 IMPLANT
CUFF TOURNIQUET SINGLE 34IN LL (TOURNIQUET CUFF) ×4 IMPLANT
CUFF TOURNIQUET SINGLE 44IN (TOURNIQUET CUFF) IMPLANT
CUTTER TOMCAT 4.0M (BURR) ×4 IMPLANT
DECANTER SPIKE VIAL GLASS SM (MISCELLANEOUS) ×4 IMPLANT
DRAPE PROXIMA HALF (DRAPES) ×4 IMPLANT
DRSG XEROFORM 1X8 (GAUZE/BANDAGES/DRESSINGS) ×4 IMPLANT
DURAPREP 26ML APPLICATOR (WOUND CARE) ×4 IMPLANT
ELECT MENISCUS 165MM 90D (ELECTRODE) IMPLANT
FORMALIN 10 PREFIL 480ML (MISCELLANEOUS) ×4 IMPLANT
GAUZE SPONGE 4X4 12PLY STRL (GAUZE/BANDAGES/DRESSINGS) ×4 IMPLANT
GAUZE SPONGE 4X4 16PLY XRAY LF (GAUZE/BANDAGES/DRESSINGS) ×4 IMPLANT
GLOVE BIO SURGEON STRL SZ8 (GLOVE) ×4 IMPLANT
GLOVE BIO SURGEON STRL SZ8.5 (GLOVE) ×4 IMPLANT
GLOVE BIOGEL PI IND STRL 7.0 (GLOVE) ×2 IMPLANT
GLOVE BIOGEL PI INDICATOR 7.0 (GLOVE) ×2
GLOVE EXAM NITRILE MD LF STRL (GLOVE) ×4 IMPLANT
GLOVE OPTIFIT SS 6.5 STRL BRWN (GLOVE) ×4 IMPLANT
GOWN STRL REUS W/TWL LRG LVL3 (GOWN DISPOSABLE) ×4 IMPLANT
GOWN STRL REUS W/TWL XL LVL3 (GOWN DISPOSABLE) ×4 IMPLANT
HANDPIECE (MISCELLANEOUS) IMPLANT
HLDR LEG FOAM (MISCELLANEOUS) ×2 IMPLANT
IV LACTATED RINGER IRRG 3000ML (IV SOLUTION) ×4
IV LR IRRIG 3000ML ARTHROMATIC (IV SOLUTION) ×4 IMPLANT
KIT BLADEGUARD II DBL (SET/KITS/TRAYS/PACK) ×4 IMPLANT
KIT ROOM TURNOVER AP CYSTO (KITS) ×4 IMPLANT
KNIFE HOOK (BLADE) IMPLANT
KNIFE MENISECTOMY (BLADE) IMPLANT
LEG HOLDER FOAM (MISCELLANEOUS) ×2
MANIFOLD NEPTUNE II (INSTRUMENTS) ×4 IMPLANT
MARKER SKIN DUAL TIP RULER LAB (MISCELLANEOUS) ×4 IMPLANT
NEEDLE HYPO 21X1.5 SAFETY (NEEDLE) ×4 IMPLANT
NEEDLE SPNL 18GX3.5 QUINCKE PK (NEEDLE) ×4 IMPLANT
NS IRRIG 1000ML POUR BTL (IV SOLUTION) ×4 IMPLANT
PACK ARTHRO LIMB DRAPE STRL (MISCELLANEOUS) ×4 IMPLANT
PAD ABD 5X9 TENDERSORB (GAUZE/BANDAGES/DRESSINGS) ×4 IMPLANT
PAD ARMBOARD 7.5X6 YLW CONV (MISCELLANEOUS) ×4 IMPLANT
PADDING CAST COTTON 6X4 STRL (CAST SUPPLIES) ×4 IMPLANT
SET ARTHROSCOPY INST (INSTRUMENTS) ×4 IMPLANT
SET BASIN LINEN APH (SET/KITS/TRAYS/PACK) ×4 IMPLANT
SPONGE GAUZE 4X4 12PLY (GAUZE/BANDAGES/DRESSINGS) ×3 IMPLANT
SUT ETHILON 3 0 FSL (SUTURE) ×4 IMPLANT
SYR 30ML LL (SYRINGE) ×4 IMPLANT
TIP 0 DEGREE (MISCELLANEOUS) IMPLANT
TIP 30 DEGREE (MISCELLANEOUS) IMPLANT
TIP 70 DEGREE (MISCELLANEOUS) IMPLANT
TUBING FLOSTEADY ARTHRO PUMP (IRRIGATION / IRRIGATOR) ×4 IMPLANT
YANKAUER SUCT BULB TIP 10FT TU (MISCELLANEOUS) ×8 IMPLANT

## 2014-12-07 NOTE — Transfer of Care (Signed)
Immediate Anesthesia Transfer of Care Note  Patient: Crystal AversKimberly C Raymond  Procedure(s) Performed: Procedure(s): KNEE ARTHROSCOPY WITH PARTIAL MEDIAL MENISECTOMY (Right)  Patient Location: PACU  Anesthesia Type:General  Level of Consciousness: awake, alert , patient cooperative and responds to stimulation  Airway & Oxygen Therapy: Patient Spontanous Breathing and Patient connected to face mask oxygen  Post-op Assessment: Report given to RN, Post -op Vital signs reviewed and stable and Patient moving all extremities X 4  Post vital signs: Reviewed and stable  Last Vitals:  Filed Vitals:   12/07/14 1030  BP: 107/54  Pulse:   Temp:   Resp: 12    Complications: No apparent anesthesia complications

## 2014-12-07 NOTE — Anesthesia Procedure Notes (Signed)
Procedure Name: LMA Insertion Date/Time: 12/07/2014 10:43 AM Performed by: Patrcia DollyMOSES, Tavie Haseman Pre-anesthesia Checklist: Patient identified, Timeout performed, Emergency Drugs available, Suction available and Patient being monitored Patient Re-evaluated:Patient Re-evaluated prior to inductionOxygen Delivery Method: Circle system utilized Preoxygenation: Pre-oxygenation with 100% oxygen Intubation Type: IV induction Ventilation: Mask ventilation without difficulty LMA: LMA inserted LMA Size: 4.0 Placement Confirmation: positive ETCO2 and breath sounds checked- equal and bilateral Tube secured with: Tape Dental Injury: Teeth and Oropharynx as per pre-operative assessment

## 2014-12-07 NOTE — Brief Op Note (Signed)
12/07/2014  11:13 AM  PATIENT:  Crystal Deleon  54 y.o. female  PRE-OPERATIVE DIAGNOSIS:  tear right medial meniscus  POST-OPERATIVE DIAGNOSIS:  tear right medial meniscus  PROCEDURE:  Procedure(s): KNEE ARTHROSCOPY WITH PARTIAL MEDIAL MENISECTOMY (Right)  SURGEON:  Surgeon(s) and Role:    * Darreld McleanWayne Marimar Suber, MD - Primary  PHYSICIAN ASSISTANT:   ASSISTANTS: none   ANESTHESIA:   general  EBL:     BLOOD ADMINISTERED:none  DRAINS: none   LOCAL MEDICATIONS USED:  MARCAINE     SPECIMEN:  Source of Specimen:  Right knee medial meniscus shavings  DISPOSITION OF SPECIMEN:  PATHOLOGY  COUNTS:  YES  TOURNIQUET:   Total Tourniquet Time Documented: Thigh (Right) - 13 minutes Total: Thigh (Right) - 13 minutes   DICTATION: .Other Dictation: Dictation Number [id number]  PLAN OF CARE: Discharge to home after PACU  PATIENT DISPOSITION:  PACU - hemodynamically stable.   Delay start of Pharmacological VTE agent (>24hrs) due to surgical blood loss or risk of bleeding: not applicable

## 2014-12-07 NOTE — Progress Notes (Signed)
The History and Physical is unchanged. I have examined the patient. The patient is medically able to have surgery on the RIGHT knee . Darreld McleanKEELING,Charleston Hankin

## 2014-12-07 NOTE — Anesthesia Preprocedure Evaluation (Addendum)
Anesthesia Evaluation  Patient identified by MRN, date of birth, ID band Patient awake    Reviewed: Allergy & Precautions, H&P , NPO status , Patient's Chart, lab work & pertinent test results  Airway Mallampati: III  TM Distance: <3 FB     Dental  (+) Edentulous Upper, Partial Lower   Pulmonary Current Smoker,  breath sounds clear to auscultation        Cardiovascular negative cardio ROS  Rhythm:Regular Rate:Normal     Neuro/Psych PSYCHIATRIC DISORDERS Depression    GI/Hepatic GERD-  Medicated and Controlled,  Endo/Other    Renal/GU      Musculoskeletal   Abdominal   Peds  Hematology   Anesthesia Other Findings   Reproductive/Obstetrics                             Anesthesia Physical Anesthesia Plan  ASA: II  Anesthesia Plan: General   Post-op Pain Management:    Induction: Intravenous  Airway Management Planned: LMA  Additional Equipment:   Intra-op Plan:   Post-operative Plan: Extubation in OR  Informed Consent: I have reviewed the patients History and Physical, chart, labs and discussed the procedure including the risks, benefits and alternatives for the proposed anesthesia with the patient or authorized representative who has indicated his/her understanding and acceptance.     Plan Discussed with:   Anesthesia Plan Comments:         Anesthesia Quick Evaluation

## 2014-12-07 NOTE — Discharge Instructions (Signed)
Use crutches/walker.  Use ice to the right knee as needed.  You may remove dressing from the knee when you desire.  You may cleanse with peroxide, otherwise keep dry.  You may apply Band-aids to the stitches if you desire.  Keep physical therapy appointments.  If you have any problem, Call Dr. Sanjuan DameKeeling's office at [phone] or if after hours, call hospital at [phone].   Arthroscopic Procedure, Knee An arthroscopic procedure can find what is wrong with your knee. PROCEDURE Arthroscopy is a surgical technique that allows your orthopedic surgeon to diagnose and treat your knee injury with accuracy. They will look into your knee through a small instrument. This is almost like a small (pencil sized) telescope. Because arthroscopy affects your knee less than open knee surgery, you can anticipate a more rapid recovery. Taking an active role by following your caregiver's instructions will help with rapid and complete recovery. Use crutches, rest, elevation, ice, and knee exercises as instructed. The length of recovery depends on various factors including type of injury, age, physical condition, medical conditions, and your rehabilitation. Your knee is the joint between the large bones (femur and tibia) in your leg. Cartilage covers these bone ends which are smooth and slippery and allow your knee to bend and move smoothly. Two menisci, thick, semi-lunar shaped pads of cartilage which form a rim inside the joint, help absorb shock and stabilize your knee. Ligaments bind the bones together and support your knee joint. Muscles move the joint, help support your knee, and take stress off the joint itself. Because of this all programs and physical therapy to rehabilitate an injured or repaired knee require rebuilding and strengthening your muscles. AFTER THE PROCEDURE  After the procedure, you will be moved to a recovery area until most of the effects of the medication have worn off. Your caregiver will  discuss the test results with you.  Only take over-the-counter or prescription medicines for pain, discomfort, or fever as directed by your caregiver. SEEK MEDICAL CARE IF:   You have increased bleeding from your wounds.  You see redness, swelling, or have increasing pain in your wounds.  You have pus coming from your wound.  You have an oral temperature above 102 F (38.9 C).  You notice a bad smell coming from the wound or dressing.  You have severe pain with any motion of your knee. SEEK IMMEDIATE MEDICAL CARE IF:   You develop a rash.  You have difficulty breathing.  You have any allergic problems. Document Released: 05/18/2000 Document Revised: 08/13/2011 Document Reviewed: 12/10/2007 [facility] Patient Information 2015 [place], [place]. This information is not intended to replace advice given to you by your health care provider. Make sure you discuss any questions you have with your health care provider.

## 2014-12-07 NOTE — Anesthesia Postprocedure Evaluation (Signed)
  Anesthesia Post-op Note  Patient: Crystal Deleon  Procedure(s) Performed: Procedure(s): KNEE ARTHROSCOPY WITH PARTIAL MEDIAL MENISECTOMY (Right)  Patient Location: PACU  Anesthesia Type:General  Level of Consciousness: awake, alert , patient cooperative and responds to stimulation  Airway and Oxygen Therapy: Patient Spontanous Breathing and Patient connected to face mask oxygen  Post-op Pain: none  Post-op Assessment: Post-op Vital signs reviewed, Patient's Cardiovascular Status Stable, Respiratory Function Stable, No signs of Nausea or vomiting and Pain level controlled              Post-op Vital Signs: Reviewed and stable  Last Vitals:  Filed Vitals:   12/07/14 1030  BP: 107/54  Pulse:   Temp:   Resp: 12    Complications: No apparent anesthesia complications

## 2014-12-08 ENCOUNTER — Encounter (HOSPITAL_COMMUNITY): Payer: Self-pay | Admitting: Orthopaedic Surgery

## 2014-12-08 NOTE — Op Note (Signed)
NAME:  Crystal Deleon, Crystal Deleon           ACCOUNT NO.:  [id number]  MEDICAL RECORD NO.:  [id number]  LOCATION:  [facility]                          FACILITY:  [facility]  PHYSICIAN:  J. Darreld Mclean, M.D. DATE OF BIRTH:  06/26/1960  DATE OF PROCEDURE:  12/07/2014 DATE OF DISCHARGE:  12/07/2014                              OPERATIVE REPORT   PREOPERATIVE DIAGNOSIS:  Tear medial meniscus of the right knee.  POSTOPERATIVE DIAGNOSIS:  Tear medial meniscus of the right knee.  PROCEDURE:  Operative arthroscopy, partial medial meniscectomy of the right knee.  ANESTHESIA:  General.  TOURNIQUET TIME:  13 minutes.  DRAINS:  None.  SPECIMENS:  Sent to pathology none.  SURGEON:  J. Darreld Mclean, M.D.  INDICATION:  The patient with pain and tenderness in the right knee. She has giving way pain.  MRI shows a tear of the medial meniscus.  The patient elected to have surgery at this particular time.  She understands it is an elective procedure.  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  DESCRIPTION OF PROCEDURE:  The patient was seen in the holding area, identified the right knee as correct surgical site.  I placed a mark on the right knee.  The patient was brought to the operating room, placed supine on the operating room table.  Tourniquet and leg holder placed, deflated right upper thigh after anesthesia of general had been given. She was then prepped and draped in usual manner.  A time-out identifying the patient as [name], doing a right knee for arthroscopy medially.  All instrumentation was properly positioned and working.  The mark for the knee was still visible.  OR team knew each other.  A small incision was made medially on the right knee and an inflow cannula was inserted.  Fusion pump was then used to inject lactated Ringer's into the knee joint.  Arthroscope inserted laterally, knee systematically  examined.  Permanent pictures were taken.  Undersurface of the patella, some grade 2 changes.  Medially, she had grade 3 changes of the tibial plateau on the femoral condyle.  There was a tear in the posterior horn of the medial meniscus.  Anterior cruciate was intact. Laterally, the meniscus looked good.  She had grade 2 changes on the lateral side of the right knee.  Attention directed back to the medial side using meniscal shaver and meniscal punch.  Good smooth contour was obtained and torn meniscus was removed.  There were no loose fragments. The knee was systematically reexamined and no pathology.  Wound was irrigated with remaining part of lactated Ringer's.  Wound was reapproximated using 3-0 nylon interrupted vertical mattress manner. Marcaine 0.25% instilled in each portal.  Tourniquet deflated after 13 minutes.  Sterile dressing applied.  Bulky dressing applied.  The patient will go to recovery in good condition.  Appropriate analgesia will be given for pain.  I will see her in the office in approximately 10 days.  If she has any difficulties, contact me through office hospital beeper system.  She has scheduled outpatient physical therapy.          ______________________________ J. Darreld Mclean, M.D.  JWK/MEDQ  D:  12/07/2014  T:  12/08/2014  Job:  [id number]

## 2015-07-13 ENCOUNTER — Encounter: Payer: Self-pay | Admitting: Orthopaedic Surgery

## 2015-07-13 ENCOUNTER — Ambulatory Visit (INDEPENDENT_AMBULATORY_CARE_PROVIDER_SITE_OTHER): Payer: Medicaid Other | Admitting: Orthopaedic Surgery

## 2015-07-13 VITALS — BP 138/78 | HR 75 | Temp 97.7°F | Resp 18 | Ht 64.0 in | Wt 191.0 lb

## 2015-07-13 DIAGNOSIS — M25561 Pain in right knee: Secondary | ICD-10-CM | POA: Diagnosis not present

## 2015-07-13 NOTE — Progress Notes (Signed)
Patient Crystal Deleon C Humphries, female DOB:1960/09/18, 55 y.o. UJW:[id number]  Chief Complaint  Patient presents with  . Follow-up    follow up right knee, "feels about the same"    HPI  Crystal Deleon is a 55 y.o. female with history of chronic right knee pain.  She has swelling and popping. She has no redness, no giving way, no locking, no new trauma.  Pain comes and goes.   Knee Pain  The pain is present in the right knee. The quality of the pain is described as aching. The pain is at a severity of 3/10. The pain is mild. The pain has been fluctuating since onset. She has tried acetaminophen, heat, ice, NSAIDs and rest for the symptoms. The treatment provided mild relief.    Body mass index is 32.77 kg/(m^2).  Review of Systems  Patient does not have Diabetes Mellitus. Patient does not have hypertension. Patient does not have COPD or shortness of breath. Patient does not have BMI > 35. Patient has current smoking history.  Review of Systems  Past Medical History  Diagnosis Date  . Depression   . Hx of adenomatous colonic polyps 2013  . Arthritis   . GERD (gastroesophageal reflux disease)     Past Surgical History  Procedure Laterality Date  . Colonoscopy  09/2011    Dr. Adah Perl diverticulosis in sigmoid colon. Multiple tubular adenomas removed (4). Recommended to have next TCS in 09/2014   . Esophagogastroduodenoscopy N/A 10/06/2012    FAO:ZHYQ erosive reflux esophagitis/Gastric and bulbar erosions of uncertain significance s/p gastric biopsy, path negative for H.pylori  . Cholecystectomy N/A 03/11/2013    Dr. Lovell Sheehan  . Knee arthroscopy with medial menisectomy Right 12/07/2014    Procedure: KNEE ARTHROSCOPY WITH PARTIAL MEDIAL MENISECTOMY;  Surgeon: Darreld Mclean, MD;  Location: [facility];  Service: Orthopedics;  Laterality: Right;    Family History  Problem Relation Age of Onset  . Colon cancer Maternal Uncle     greater than age 19  . Liver disease Neg Hx   .  Diabetes Mother   . Diabetes Father     Social History Social History  Substance Use Topics  . Smoking status: Current Every Day Smoker -- 1.00 packs/day for 34 years    Types: Cigarettes  . Smokeless tobacco: None  . Alcohol Use: No    No Known Allergies  Current Outpatient Prescriptions  Medication Sig Dispense Refill  . HYDROcodone-acetaminophen (NORCO) 7.5-325 MG per tablet Take 1 tablet by mouth every 4 (four) hours as needed for moderate pain. 120 tablet 0  . naproxen (NAPROSYN) 500 MG tablet Take 500 mg by mouth 2 (two) times daily with a meal.    . polyethylene glycol powder (MIRALAX) powder Take 17 g by mouth at bedtime as needed (constipation). Take 1 capful twice a day for 3 days, then take 1 capful at bedtime as needed. 527 g 11   No current facility-administered medications for this visit.     Physical Exam  Blood pressure 138/78, pulse 75, temperature 97.7 F (36.5 C), resp. rate 18, height  (1.626 m), weight 191 lb (86.637 kg).  Constitutional: overall normal hygiene, normal nutrition, well developed, normal grooming, normal body habitus. Assistive device:none  Musculoskeletal: gait and station Limp slight to the right, muscle tone and strength are normal, no tremors or atrophy is present.  .  Neurological: coordination overall normal.  Deep tendon reflex/nerve stretch intact.  Sensation normal.  Cranial nerves II-XII intact.   Skin:normal  overall no scars, lesions, ulcers or rash es. No psoriasis.  Psychiatric: Alert and oriented x 3.  Recent memory intact, remote memory unclear.  Normal mood and affect. Well groomed.  Good eye contact.  Cardiovascular: overall no swelling, no varicosities, no edema bilaterally, normal temperatures of the legs and arms, no clubbing, cyanosis and good capillary refill.  Lymphatic: palpation is normal.  The right lower extremity is examined:  Inspection:  Thigh:  Non-tender and no defects  Knee has swelling 1+  effusion.                        Joint tenderness is present                        Patient is tender over the medial joint line  Lower Leg:  Has normal appearance and no tenderness or defects  Ankle:  Non-tender and no defects  Foot:  Non-tender and no defects Range of Motion:  Knee:  Range of motion is: 0 to 105                        Crepitus is  present  Ankle:  Range of motion is normal. Strength and Tone:  The left lower extremity has normal strength and tone. Stability:  Knee:  The knee is stable.  Ankle:  The ankle is stable.  I have talked to her about her smoking. She says she cannot quit now.  She has tried but just cannot succeed.  She has no shortness of breath and no cough.    Her GERD often prevents her from taking NSAIDs at times.  I told her to take Tylenol on those occassions as needed.  PLAN Call if any problems.  Precautions discussed.  Continue current medications.   Return to clinic 3 months

## 2015-07-13 NOTE — Patient Instructions (Signed)
Take medicine as needed.  Try to be active.  Try to stop smoking.

## 2015-07-29 ENCOUNTER — Telehealth: Payer: Self-pay | Admitting: Orthopaedic Surgery

## 2015-07-29 NOTE — Telephone Encounter (Signed)
Patient called requesting a refill on Hydrocodone 7.5-325 mgs.  Qty 120

## 2015-08-01 MED ORDER — HYDROCODONE-ACETAMINOPHEN 7.5-325 MG PO TABS: 1.0000 | ORAL_TABLET | ORAL | Status: DC | PRN

## 2015-08-01 NOTE — Telephone Encounter (Signed)
Prescription available, called patient, no answer 

## 2015-08-01 NOTE — Telephone Encounter (Signed)
Rx done. 

## 2015-08-30 ENCOUNTER — Telehealth: Payer: Self-pay | Admitting: Orthopaedic Surgery

## 2015-08-30 MED ORDER — HYDROCODONE-ACETAMINOPHEN 7.5-325 MG PO TABS: 1.0000 | ORAL_TABLET | ORAL | Status: DC | PRN

## 2015-08-30 NOTE — Telephone Encounter (Signed)
Norco  7.5-325 mgs.  Qty  120     Sig: Take 1 tablet by mouth every 4 (four) hours as needed for moderate pain.

## 2015-08-30 NOTE — Telephone Encounter (Signed)
Rx done. 

## 2015-09-08 ENCOUNTER — Encounter: Payer: Self-pay | Admitting: Orthopaedic Surgery

## 2015-09-08 ENCOUNTER — Ambulatory Visit (INDEPENDENT_AMBULATORY_CARE_PROVIDER_SITE_OTHER): Payer: Medicaid Other | Admitting: Orthopaedic Surgery

## 2015-09-08 VITALS — BP 112/61 | HR 78 | Temp 97.2°F | Ht 64.5 in | Wt 194.6 lb

## 2015-09-08 DIAGNOSIS — M25561 Pain in right knee: Secondary | ICD-10-CM

## 2015-09-08 NOTE — Progress Notes (Signed)
Crystal Deleon, female DOB:Mar 24, 1961, 55 y.o. UJW:[id number]  Chief Complaint  Crystal presents with  . Knee Pain    right side    HPI  Crystal Deleon is a 55 y.o. female who has chronic right knee pain.  She has swelling and popping but no giving way, no locking, no redness, no trauma.   Knee Pain  The pain is present in the right knee. The quality of the pain is described as aching. The pain is at a severity of 3/10. The pain is mild. The pain has been fluctuating since onset. Associated symptoms include a loss of motion. Pertinent negatives include no loss of sensation, muscle weakness, numbness or tingling. The symptoms are aggravated by weight bearing. She has tried ice, immobilization, heat, non-weight bearing and NSAIDs for the symptoms. The treatment provided moderate relief.    Body mass index is 32.9 kg/(m^2).  Review of Systems  HENT: Negative for congestion.   Respiratory: Negative for cough and shortness of breath.   Cardiovascular: Negative for leg swelling.  Endocrine: Positive for cold intolerance.  Musculoskeletal: Positive for joint swelling, arthralgias and gait problem.  Allergic/Immunologic: Positive for environmental allergies.  Neurological: Negative for tingling and numbness.    Past Medical History  Diagnosis Date  . Depression   . Hx of adenomatous colonic polyps 2013  . Arthritis   . GERD (gastroesophageal reflux disease)     Past Surgical History  Procedure Laterality Date  . Colonoscopy  09/2011    Dr. Adah Perl diverticulosis in sigmoid colon. Multiple tubular adenomas removed (4). Recommended to have next TCS in 09/2014   . Esophagogastroduodenoscopy N/A 10/06/2012    FAO:ZHYQ erosive reflux esophagitis/Gastric and bulbar erosions of uncertain significance s/p gastric biopsy, path negative for H.pylori  . Cholecystectomy N/A 03/11/2013    Dr. Lovell Sheehan  . Knee arthroscopy with medial menisectomy Right 12/07/2014    Procedure:  KNEE ARTHROSCOPY WITH PARTIAL MEDIAL MENISECTOMY;  Surgeon: Darreld Mclean, MD;  Location: [facility];  Service: Orthopedics;  Laterality: Right;    Family History  Problem Relation Age of Onset  . Colon cancer Maternal Uncle     greater than age 33  . Liver disease Neg Hx   . Diabetes Mother   . Diabetes Father     Social History Social History  Substance Use Topics  . Smoking status: Current Every Day Smoker -- 1.00 packs/day for 34 years    Types: Cigarettes  . Smokeless tobacco: None  . Alcohol Use: No    No Known Allergies  Current Outpatient Prescriptions  Medication Sig Dispense Refill  . HYDROcodone-acetaminophen (NORCO) 7.5-325 MG tablet Take 1 tablet by mouth every 4 (four) hours as needed for moderate pain. 120 tablet 0  . naproxen (NAPROSYN) 500 MG tablet Take 500 mg by mouth 2 (two) times daily with a meal.    . polyethylene glycol powder (MIRALAX) powder Take 17 g by mouth at bedtime as needed (constipation). Take 1 capful twice a day for 3 days, then take 1 capful at bedtime as needed. 527 g 11   No current facility-administered medications for this visit.     Physical Exam  Blood pressure 112/61, pulse 78, temperature 97.2 F (36.2 C), temperature source Tympanic, height 5' 4.5" (1.638 m), weight 194 lb 9.6 oz (88.27 kg).  Constitutional: overall normal hygiene, normal nutrition, well developed, normal grooming, normal body habitus. Assistive device:none  Musculoskeletal: gait and station Limp right, muscle tone and strength are normal, no tremors or  atrophy is present.  .  Neurological: coordination overall normal.  Deep tendon reflex/nerve stretch intact.  Sensation normal.  Cranial nerves II-XII intact.   Skin:   normal overall no scars, lesions, ulcers or rashes. No psoriasis.  Psychiatric: Alert and oriented x 3.  Recent memory intact, remote memory unclear.  Normal mood and affect. Well groomed.  Good eye contact.  Cardiovascular: overall no swelling,  no varicosities, no edema bilaterally, normal temperatures of the legs and arms, no clubbing, cyanosis and good capillary refill.  Lymphatic: palpation is normal.  The right lower extremity is examined:  Inspection:  Thigh:  Non-tender and no defects  Knee has swelling 2+ effusion.                        Joint tenderness is present                        Crystal is tender over the medial joint line  Lower Leg:  Has normal appearance and no tenderness or defects  Ankle:  Non-tender and no defects  Foot:  Non-tender and no defects Range of Motion:  Knee:  Range of motion is: 0-105                        Crepitus is  present  Ankle:  Range of motion is normal. Strength and Tone:  The right lower extremity has normal strength and tone. Stability:  Knee:  The knee is stable.  Ankle:  The ankle is stable.  The Crystal has been educated about the nature of the problem(s) and counseled on treatment options.  The Crystal appeared to understand what I have discussed and is in agreement with it.  Encounter Diagnosis  Name Primary?  . Right knee pain Yes    PLAN Call if any problems.  Precautions discussed.  Continue current medications.   Return to clinic 2 months

## 2015-09-29 ENCOUNTER — Telehealth: Payer: Self-pay | Admitting: Orthopaedic Surgery

## 2015-09-29 MED ORDER — HYDROCODONE-ACETAMINOPHEN 7.5-325 MG PO TABS: 1.0000 | ORAL_TABLET | ORAL | Status: DC | PRN

## 2015-09-29 NOTE — Telephone Encounter (Signed)
Hydrocodone-Acetaminophen 7.5/325mg Qty 120 Tablets °

## 2015-09-29 NOTE — Telephone Encounter (Signed)
Rx done. 

## 2015-10-27 ENCOUNTER — Telehealth: Payer: Self-pay | Admitting: Orthopaedic Surgery

## 2015-10-27 MED ORDER — HYDROCODONE-ACETAMINOPHEN 5-325 MG PO TABS
1.0000 | ORAL_TABLET | ORAL | Status: DC | PRN
Start: 2015-10-27 — End: 2015-11-28

## 2015-10-27 NOTE — Telephone Encounter (Signed)
Patient called and requested a refill on Hydrocodone-Acetaminophen 7.5-325 mgs.   Qty 120    °Sig: Take 1 tablet by mouth every 4 (four) hours as needed for moderate pain.    °      °  °  ° ° °

## 2015-10-27 NOTE — Telephone Encounter (Signed)
Rx done. 

## 2015-11-10 ENCOUNTER — Encounter: Payer: Self-pay | Admitting: Orthopaedic Surgery

## 2015-11-10 ENCOUNTER — Ambulatory Visit (INDEPENDENT_AMBULATORY_CARE_PROVIDER_SITE_OTHER): Payer: Medicaid Other | Admitting: Orthopaedic Surgery

## 2015-11-10 VITALS — BP 134/74 | HR 75 | Temp 97.7°F | Resp 16 | Ht 63.5 in | Wt 191.0 lb

## 2015-11-10 DIAGNOSIS — K219 Gastro-esophageal reflux disease without esophagitis: Secondary | ICD-10-CM | POA: Diagnosis not present

## 2015-11-10 DIAGNOSIS — M25561 Pain in right knee: Secondary | ICD-10-CM

## 2015-11-10 NOTE — Progress Notes (Signed)
Patient ZO:XWRUEAVW:Crystal Deleon, female DOB:08-16-1960, 55 y.o. UJW:[id number]  Chief Complaint  Patient presents with  . Follow-up    RIGHT KNEE PAIN    HPI  Crystal Deleon is a 55 y.o. female who has chronic pain and swelling of her right knee.  She has no giving way, no trauma, no redness, no paresthesias.  She is using a cane now.  HPI  Body mass index is 33.3 kg/(m^2).  ROS  Review of Systems  HENT: Negative for congestion.   Respiratory: Negative for cough and shortness of breath.   Cardiovascular: Negative for leg swelling.  Endocrine: Positive for cold intolerance.  Musculoskeletal: Positive for joint swelling, arthralgias and gait problem.  Allergic/Immunologic: Positive for environmental allergies.  Neurological: Negative for numbness.    Past Medical History  Diagnosis Date  . Depression   . Hx of adenomatous colonic polyps 2013  . Arthritis   . GERD (gastroesophageal reflux disease)     Past Surgical History  Procedure Laterality Date  . Colonoscopy  09/2011    Dr. Adah PerlBenson-->mild diverticulosis in sigmoid colon. Multiple tubular adenomas removed (4). Recommended to have next TCS in 09/2014   . Esophagogastroduodenoscopy N/A 10/06/2012    FAO:ZHYQRMR:Mild erosive reflux esophagitis/Gastric and bulbar erosions of uncertain significance s/p gastric biopsy, path negative for H.pylori  . Cholecystectomy N/A 03/11/2013    Dr. Lovell SheehanJenkins  . Knee arthroscopy with medial menisectomy Right 12/07/2014    Procedure: KNEE ARTHROSCOPY WITH PARTIAL MEDIAL MENISECTOMY;  Surgeon: Darreld McleanWayne Eliab Closson, MD;  Location: [facility];  Service: Orthopedics;  Laterality: Right;    Family History  Problem Relation Age of Onset  . Colon cancer Maternal Uncle     greater than age 53  . Liver disease Neg Hx   . Diabetes Mother   . Diabetes Father     Social History Social History  Substance Use Topics  . Smoking status: Current Every Day Smoker -- 1.00 packs/day for 34 years    Types: Cigarettes  .  Smokeless tobacco: None  . Alcohol Use: No    No Known Allergies  Current Outpatient Prescriptions  Medication Sig Dispense Refill  . HYDROcodone-acetaminophen (NORCO/VICODIN) 5-325 MG tablet Take 1 tablet by mouth every 4 (four) hours as needed for moderate pain (Must last 30 days.  Do not take and drive a car or use machinery.). 120 tablet 0  . naproxen (NAPROSYN) 500 MG tablet Take 500 mg by mouth 2 (two) times daily with a meal.    . polyethylene glycol powder (MIRALAX) powder Take 17 g by mouth at bedtime as needed (constipation). Take 1 capful twice a day for 3 days, then take 1 capful at bedtime as needed. 527 g 11   No current facility-administered medications for this visit.     Physical Exam  Blood pressure 134/74, pulse 75, temperature 97.7 F (36.5 C), resp. rate 16, height 5' 3.5" (1.613 m), weight 191 lb (86.637 kg).  Constitutional: overall normal hygiene, normal nutrition, well developed, normal grooming, normal body habitus. Assistive device:cane  Musculoskeletal: gait and station Limp right, muscle tone and strength are normal, no tremors or atrophy is present.  .  Neurological: coordination overall normal.  Deep tendon reflex/nerve stretch intact.  Sensation normal.  Cranial nerves II-XII intact.   Skin:   normal overall no scars, lesions, ulcers or rashes. No psoriasis.  Psychiatric: Alert and oriented x 3.  Recent memory intact, remote memory unclear.  Normal mood and affect. Well groomed.  Good eye contact.  Cardiovascular: overall no swelling, no varicosities, no edema bilaterally, normal temperatures of the legs and arms, no clubbing, cyanosis and good capillary refill.  Lymphatic: palpation is normal.  The right lower extremity is examined:  Inspection:  Thigh:  Non-tender and no defects  Knee has swelling 3+ effusion.  She declines aspiration                        Joint tenderness is present                        Patient is tender over the medial  joint line  Lower Leg:  Has normal appearance and no tenderness or defects  Ankle:  Non-tender and no defects  Foot:  Non-tender and no defects Range of Motion:  Knee:  Range of motion is: 0-100                        Crepitus is  present  Ankle:  Range of motion is normal. Strength and Tone:  The right lower extremity has normal strength and tone. Stability:  Knee:  The knee is stable.  Ankle:  The ankle is stable.    The patient has been educated about the nature of the problem(s) and counseled on treatment options.  The patient appeared to understand what I have discussed and is in agreement with it.  Encounter Diagnoses  Name Primary?  . Right knee pain Yes  . Gastroesophageal reflux disease, esophagitis presence not specified     PLAN Call if any problems.  Precautions discussed.  Continue current medications.   Return to clinic 3 months   Electronically Signed Darreld Mclean, MD 6/8/201710:11 AM

## 2015-11-28 ENCOUNTER — Telehealth: Payer: Self-pay | Admitting: Orthopaedic Surgery

## 2015-11-28 MED ORDER — HYDROCODONE-ACETAMINOPHEN 5-325 MG PO TABS: 1.0000 | ORAL_TABLET | ORAL | Status: DC | PRN

## 2015-11-28 NOTE — Telephone Encounter (Signed)
Rx done. 

## 2015-11-28 NOTE — Telephone Encounter (Signed)
Hydrocodone-Acetaminophen  5/325mg  Qty 120 Tablets °

## 2015-12-28 ENCOUNTER — Telehealth: Payer: Self-pay | Admitting: Orthopaedic Surgery

## 2015-12-28 MED ORDER — HYDROCODONE-ACETAMINOPHEN 5-325 MG PO TABS: 1.0000 | ORAL_TABLET | ORAL | 0 refills | Status: DC | PRN

## 2015-12-28 NOTE — Telephone Encounter (Signed)
Hydrocodone-Acetaminophen  5/325mg  Qty 120 Tablets °

## 2016-01-26 ENCOUNTER — Telehealth: Payer: Self-pay | Admitting: Orthopaedic Surgery

## 2016-01-26 MED ORDER — HYDROCODONE-ACETAMINOPHEN 5-325 MG PO TABS: 1.0000 | ORAL_TABLET | ORAL | 0 refills | Status: DC | PRN

## 2016-01-26 NOTE — Telephone Encounter (Signed)
Patient requests a refill on Hydrocodone/Acetaminophen (Norco) 5-325 mgs.   Qty  120 ° °Sig: Take 1 tablet by mouth every 4 (four) hours as needed for moderate pain (Must last 30 days.  Do not take and drive a car or use machinery.). °

## 2016-02-09 ENCOUNTER — Ambulatory Visit: Payer: Medicaid Other | Admitting: Orthopaedic Surgery

## 2016-02-22 ENCOUNTER — Encounter (INDEPENDENT_AMBULATORY_CARE_PROVIDER_SITE_OTHER): Payer: Self-pay

## 2016-02-22 ENCOUNTER — Ambulatory Visit (INDEPENDENT_AMBULATORY_CARE_PROVIDER_SITE_OTHER): Payer: Medicaid Other | Admitting: Orthopaedic Surgery

## 2016-02-22 ENCOUNTER — Encounter: Payer: Self-pay | Admitting: Orthopaedic Surgery

## 2016-02-22 VITALS — BP 130/81 | HR 78 | Temp 98.1°F | Ht 63.0 in | Wt 193.0 lb

## 2016-02-22 DIAGNOSIS — F17210 Nicotine dependence, cigarettes, uncomplicated: Secondary | ICD-10-CM | POA: Diagnosis not present

## 2016-02-22 DIAGNOSIS — M25561 Pain in right knee: Secondary | ICD-10-CM

## 2016-02-22 MED ORDER — HYDROCODONE-ACETAMINOPHEN 7.5-325 MG PO TABS: ORAL_TABLET | ORAL | 0 refills | Status: DC

## 2016-02-22 NOTE — Progress Notes (Signed)
Patient Crystal Deleon, female DOB:02-24-1961, 55 y.o. UJW:[id number]  Chief Complaint  Patient presents with  . Follow-up    Right knee pain    HPI  Crystal Deleon is a 55 y.o. female who has chronic right knee pain.  She has no new trauma. She has swelling and popping but no giving way or locking.  She is active and taking her medicine.    She smokes and is willing to cut back but not truly quit.  I have discussed this with her and given a smoking cessation sheet. HPI  Body mass index is 34.19 kg/m.  ROS  Review of Systems  HENT: Negative for congestion.   Respiratory: Negative for cough and shortness of breath.   Cardiovascular: Negative for leg swelling.  Endocrine: Positive for cold intolerance.  Musculoskeletal: Positive for arthralgias, gait problem and joint swelling.  Allergic/Immunologic: Positive for environmental allergies.  Neurological: Negative for numbness.    Past Medical History:  Diagnosis Date  . Arthritis   . Depression   . GERD (gastroesophageal reflux disease)   . Hx of adenomatous colonic polyps 2013    Past Surgical History:  Procedure Laterality Date  . CHOLECYSTECTOMY N/A 03/11/2013   Dr. Lovell Sheehan  . COLONOSCOPY  09/2011   Dr. Adah Perl diverticulosis in sigmoid colon. Multiple tubular adenomas removed (4). Recommended to have next TCS in 09/2014   . ESOPHAGOGASTRODUODENOSCOPY N/A 10/06/2012   FAO:ZHYQ erosive reflux esophagitis/Gastric and bulbar erosions of uncertain significance s/p gastric biopsy, path negative for H.pylori  . KNEE ARTHROSCOPY WITH MEDIAL MENISECTOMY Right 12/07/2014   Procedure: KNEE ARTHROSCOPY WITH PARTIAL MEDIAL MENISECTOMY;  Surgeon: Darreld Mclean, MD;  Location: [facility];  Service: Orthopedics;  Laterality: Right;    Family History  Problem Relation Age of Onset  . Colon cancer Maternal Uncle     greater than age 82  . Liver disease Neg Hx   . Diabetes Mother   . Diabetes Father     Social  History Social History  Substance Use Topics  . Smoking status: Current Every Day Smoker    Packs/day: 1.00    Years: 34.00    Types: Cigarettes  . Smokeless tobacco: Not on file  . Alcohol use No    No Known Allergies  Current Outpatient Prescriptions  Medication Sig Dispense Refill  . HYDROcodone-acetaminophen (NORCO) 7.5-325 MG tablet One every six hours for pain as needed.  Do not drive car or operate machinery while taking this medicine.  Must last 14 days. 56 tablet 0  . naproxen (NAPROSYN) 500 MG tablet Take 500 mg by mouth 2 (two) times daily with a meal.    . polyethylene glycol powder (MIRALAX) powder Take 17 g by mouth at bedtime as needed (constipation). Take 1 capful twice a day for 3 days, then take 1 capful at bedtime as needed. 527 g 11   No current facility-administered medications for this visit.      Physical Exam  Blood pressure 130/81, pulse 78, temperature 98.1 F (36.7 C), height 5\' 3"  (1.6 m), weight 193 lb (87.5 kg).  Constitutional: overall normal hygiene, normal nutrition, well developed, normal grooming, normal body habitus. Assistive device:none  Musculoskeletal: gait and station Limp right, muscle tone and strength are normal, no tremors or atrophy is present.  .  Neurological: coordination overall normal.  Deep tendon reflex/nerve stretch intact.  Sensation normal.  Cranial nerves II-XII intact.   Skin:   Normal overall no scars, lesions, ulcers or rashes. No psoriasis.  Psychiatric: Alert and oriented x 3.  Recent memory intact, remote memory unclear.  Normal mood and affect. Well groomed.  Good eye contact.  Cardiovascular: overall no swelling, no varicosities, no edema bilaterally, normal temperatures of the legs and arms, no clubbing, cyanosis and good capillary refill.  Lymphatic: palpation is normal.  The right lower extremity is examined:  Inspection:  Thigh:  Non-tender and no defects  Knee has swelling 1+ effusion.                         Joint tenderness is present                        Patient is tender over the medial joint line  Lower Leg:  Has normal appearance and no tenderness or defects  Ankle:  Non-tender and no defects  Foot:  Non-tender and no defects Range of Motion:  Knee:  Range of motion is: 0-110                        Crepitus is  present  Ankle:  Range of motion is normal. Strength and Tone:  The right lower extremity has normal strength and tone. Stability:  Knee:  The knee is stable.  Ankle:  The ankle is stable.    The patient has been educated about the nature of the problem(s) and counseled on treatment options.  The patient appeared to understand what I have discussed and is in agreement with it.  Encounter Diagnoses  Name Primary?  . Right knee pain   . Cigarette nicotine dependence without complication Yes    PLAN Call if any problems.  Precautions discussed.  Continue current medications.   Return to clinic 3 months   Electronically Signed Darreld McleanWayne Magdelena Kinsella, MD 9/20/201710:39 AM

## 2016-02-22 NOTE — Patient Instructions (Signed)
Smoking Cessation, Tips for Success If you are ready to quit smoking, congratulations! You have chosen to help yourself be healthier. Cigarettes bring nicotine, tar, carbon monoxide, and other irritants into your body. Your lungs, heart, and blood vessels will be able to work better without these poisons. There are many different ways to quit smoking. Nicotine gum, nicotine patches, a nicotine inhaler, or nicotine nasal spray can help with physical craving. Hypnosis, support groups, and medicines help break the habit of smoking. WHAT THINGS CAN I DO TO MAKE QUITTING EASIER?  Here are some tips to help you quit for good:  Pick a date when you will quit smoking completely. Tell all of your friends and family about your plan to quit on that date.  Do not try to slowly cut down on the number of cigarettes you are smoking. Pick a quit date and quit smoking completely starting on that day.  Throw away all cigarettes.   Clean and remove all ashtrays from your home, work, and car.  On a card, write down your reasons for quitting. Carry the card with you and read it when you get the urge to smoke.  Cleanse your body of nicotine. Drink enough water and fluids to keep your urine clear or pale yellow. Do this after quitting to flush the nicotine from your body.  Learn to predict your moods. Do not let a bad situation be your excuse to have a cigarette. Some situations in your life might tempt you into wanting a cigarette.  Never have "just one" cigarette. It leads to wanting another and another. Remind yourself of your decision to quit.  Change habits associated with smoking. If you smoked while driving or when feeling stressed, try other activities to replace smoking. Stand up when drinking your coffee. Brush your teeth after eating. Sit in a different chair when you read the paper. Avoid alcohol while trying to quit, and try to drink fewer caffeinated beverages. Alcohol and caffeine may urge you to  smoke.  Avoid foods and drinks that can trigger a desire to smoke, such as sugary or spicy foods and alcohol.  Ask people who smoke not to smoke around you.  Have something planned to do right after eating or having a cup of coffee. For example, plan to take a walk or exercise.  Try a relaxation exercise to calm you down and decrease your stress. Remember, you may be tense and nervous for the first 2 weeks after you quit, but this will pass.  Find new activities to keep your hands busy. Play with a pen, coin, or rubber band. Doodle or draw things on paper.  Brush your teeth right after eating. This will help cut down on the craving for the taste of tobacco after meals. You can also try mouthwash.   Use oral substitutes in place of cigarettes. Try using lemon drops, carrots, cinnamon sticks, or chewing gum. Keep them handy so they are available when you have the urge to smoke.  When you have the urge to smoke, try deep breathing.  Designate your home as a nonsmoking area.  If you are a heavy smoker, ask your health care provider about a prescription for nicotine chewing gum. It can ease your withdrawal from nicotine.  Reward yourself. Set aside the cigarette money you save and buy yourself something nice.  Look for support from others. Join a support group or smoking cessation program. Ask someone at home or at work to help you with your plan   to quit smoking.  Always ask yourself, "Do I need this cigarette or is this just a reflex?" Tell yourself, "Today, I choose not to smoke," or "I do not want to smoke." You are reminding yourself of your decision to quit.  Do not replace cigarette smoking with electronic cigarettes (commonly called e-cigarettes). The safety of e-cigarettes is unknown, and some may contain harmful chemicals.  If you relapse, do not give up! Plan ahead and think about what you will do the next time you get the urge to smoke. HOW WILL I FEEL WHEN I QUIT SMOKING? You  may have symptoms of withdrawal because your body is used to nicotine (the addictive substance in cigarettes). You may crave cigarettes, be irritable, feel very hungry, cough often, get headaches, or have difficulty concentrating. The withdrawal symptoms are only temporary. They are strongest when you first quit but will go away within 10-14 days. When withdrawal symptoms occur, stay in control. Think about your reasons for quitting. Remind yourself that these are signs that your body is healing and getting used to being without cigarettes. Remember that withdrawal symptoms are easier to treat than the major diseases that smoking can cause.  Even after the withdrawal is over, expect periodic urges to smoke. However, these cravings are generally short lived and will go away whether you smoke or not. Do not smoke! WHAT RESOURCES ARE AVAILABLE TO HELP ME QUIT SMOKING? Your health care provider can direct you to community resources or hospitals for support, which may include:  Group support.  Education.  Hypnosis.  Therapy.   This information is not intended to replace advice given to you by your health care provider. Make sure you discuss any questions you have with your health care provider.   Document Released: 02/17/2004 Document Revised: 06/11/2014 Document Reviewed: 11/06/2012 Elsevier Interactive Patient Education 2016 [employer]  

## 2016-03-12 ENCOUNTER — Telehealth: Payer: Self-pay | Admitting: Orthopaedic Surgery

## 2016-03-12 MED ORDER — HYDROCODONE-ACETAMINOPHEN 7.5-325 MG PO TABS: ORAL_TABLET | ORAL | 0 refills | Status: DC

## 2016-03-12 NOTE — Telephone Encounter (Signed)
Patient requests a refill on Hydrocodone/Acetaminophen 7.5-325 mgs.  Qty  56  Sig: One every six hours for pain as needed. Do not drive car or operate machinery while taking this medicine. Must last 14 days.

## 2016-03-27 ENCOUNTER — Telehealth: Payer: Self-pay | Admitting: Orthopaedic Surgery

## 2016-03-27 MED ORDER — HYDROCODONE-ACETAMINOPHEN 7.5-325 MG PO TABS: ORAL_TABLET | ORAL | 0 refills | Status: DC

## 2016-03-27 NOTE — Telephone Encounter (Signed)
Patient called and requested a refill on Hydrocodone/Acetaminophen 7.5-325 mgs.  Qty  50  Sig: One every six hours for pain as needed. Do not drive car or operate machinery while taking this medicine. Must last 14 days.

## 2016-04-10 ENCOUNTER — Telehealth: Payer: Self-pay | Admitting: Orthopaedic Surgery

## 2016-04-10 MED ORDER — HYDROCODONE-ACETAMINOPHEN 7.5-325 MG PO TABS: ORAL_TABLET | ORAL | 0 refills | Status: DC

## 2016-04-10 NOTE — Telephone Encounter (Signed)
HYDROcodone-acetaminophen (NORCO) 7.5-325 MG tablet 45 tablet  Patient requests refill of this medication

## 2016-04-24 ENCOUNTER — Telehealth: Payer: Self-pay | Admitting: Orthopaedic Surgery

## 2016-04-24 MED ORDER — HYDROCODONE-ACETAMINOPHEN 7.5-325 MG PO TABS: ORAL_TABLET | ORAL | 0 refills | Status: DC

## 2016-04-24 NOTE — Telephone Encounter (Signed)
Patient requests refill:  HYDROcodone-acetaminophen (NORCO) 7.5-325 MG tablet 40 tablet    -also re-sent link to patient to re-activate MyChart, per request.

## 2016-05-08 ENCOUNTER — Telehealth: Payer: Self-pay | Admitting: Orthopaedic Surgery

## 2016-05-08 MED ORDER — HYDROCODONE-ACETAMINOPHEN 7.5-325 MG PO TABS: ORAL_TABLET | ORAL | 0 refills | Status: DC

## 2016-05-08 NOTE — Telephone Encounter (Signed)
Patient requests refill:  °HYDROcodone-acetaminophen (NORCO) 7.5-325 MG tablet 40 tablet  ° ° °

## 2016-05-23 ENCOUNTER — Ambulatory Visit (INDEPENDENT_AMBULATORY_CARE_PROVIDER_SITE_OTHER): Payer: Medicaid Other | Admitting: Orthopaedic Surgery

## 2016-05-23 ENCOUNTER — Encounter: Payer: Self-pay | Admitting: Orthopaedic Surgery

## 2016-05-23 VITALS — BP 119/70 | HR 79 | Temp 97.2°F | Ht 63.0 in | Wt 201.0 lb

## 2016-05-23 DIAGNOSIS — G8929 Other chronic pain: Secondary | ICD-10-CM

## 2016-05-23 DIAGNOSIS — F17210 Nicotine dependence, cigarettes, uncomplicated: Secondary | ICD-10-CM

## 2016-05-23 DIAGNOSIS — M25561 Pain in right knee: Secondary | ICD-10-CM | POA: Diagnosis not present

## 2016-05-23 MED ORDER — HYDROCODONE-ACETAMINOPHEN 7.5-325 MG PO TABS: ORAL_TABLET | ORAL | 0 refills | Status: DC

## 2016-05-23 NOTE — Progress Notes (Signed)
Patient XB:[id number]:Crystal Deleon, female DOB:1960/12/30, 55 y.o. WNU:[id number]  Chief Complaint  Patient presents with  . Follow-up    Right knee pain    HPI  Crystal Deleon is a 55 y.o. female who has chronic pain of the right knee. She has no new trauma. She has swelling and popping but no giving way.  She is active. She has no redness.  She is taking her medicine. HPI  Body mass index is 35.61 kg/m.  ROS  Review of Systems  HENT: Negative for congestion.   Respiratory: Negative for cough and shortness of breath.   Cardiovascular: Negative for leg swelling.  Endocrine: Positive for cold intolerance.  Musculoskeletal: Positive for arthralgias, gait problem and joint swelling.  Allergic/Immunologic: Positive for environmental allergies.  Neurological: Negative for numbness.    Past Medical History:  Diagnosis Date  . Arthritis   . Depression   . GERD (gastroesophageal reflux disease)   . Hx of adenomatous colonic polyps 2013    Past Surgical History:  Procedure Laterality Date  . CHOLECYSTECTOMY N/A 03/11/2013   Dr. Lovell SheehanJenkins  . COLONOSCOPY  09/2011   Dr. Adah PerlBenson-->mild diverticulosis in sigmoid colon. Multiple tubular adenomas removed (4). Recommended to have next TCS in 09/2014   . ESOPHAGOGASTRODUODENOSCOPY N/A 10/06/2012   IHK:VQQVRMR:Mild erosive reflux esophagitis/Gastric and bulbar erosions of uncertain significance s/p gastric biopsy, path negative for H.pylori  . KNEE ARTHROSCOPY WITH MEDIAL MENISECTOMY Right 12/07/2014   Procedure: KNEE ARTHROSCOPY WITH PARTIAL MEDIAL MENISECTOMY;  Surgeon: Darreld McleanWayne Trystyn Sitts, MD;  Location: [facility];  Service: Orthopedics;  Laterality: Right;    Family History  Problem Relation Age of Onset  . Colon cancer Maternal Uncle     greater than age 53  . Diabetes Mother   . Diabetes Father   . Liver disease Neg Hx     Social History Social History  Substance Use Topics  . Smoking status: Current Every Day Smoker    Packs/day: 1.00    Years:  34.00    Types: Cigarettes  . Smokeless tobacco: Never Used  . Alcohol use No    No Known Allergies  Current Outpatient Prescriptions  Medication Sig Dispense Refill  . HYDROcodone-acetaminophen (NORCO) 7.5-325 MG tablet One every six hours for pain as needed.  Do not drive car or operate machinery while taking this medicine.  Must last 14 days. 35 tablet 0  . naproxen (NAPROSYN) 500 MG tablet Take 500 mg by mouth 2 (two) times daily with a meal.    . polyethylene glycol powder (MIRALAX) powder Take 17 g by mouth at bedtime as needed (constipation). Take 1 capful twice a day for 3 days, then take 1 capful at bedtime as needed. 527 g 11   No current facility-administered medications for this visit.      Physical Exam  Blood pressure 119/70, pulse 79, temperature 97.2 F (36.2 C), height 5\' 3"  (1.6 m), weight 201 lb (91.2 kg).  Constitutional: overall normal hygiene, normal nutrition, well developed, normal grooming, normal body habitus. Assistive device:none  Musculoskeletal: gait and station Limp right, muscle tone and strength are normal, no tremors or atrophy is present.  .  Neurological: coordination overall normal.  Deep tendon reflex/nerve stretch intact.  Sensation normal.  Cranial nerves II-XII intact.   Skin:   Normal overall no scars, lesions, ulcers or rashes. No psoriasis.  Psychiatric: Alert and oriented x 3.  Recent memory intact, remote memory unclear.  Normal mood and affect. Well groomed.  Good eye contact.  Cardiovascular: overall no swelling, no varicosities, no edema bilaterally, normal temperatures of the legs and arms, no clubbing, cyanosis and good capillary refill.  Lymphatic: palpation is normal.  The right lower extremity is examined:  Inspection:  Thigh:  Non-tender and no defects  Knee has swelling 2+ effusion.                        Joint tenderness is present                        Patient is tender over the medial joint line  Lower Leg:  Has  normal appearance and no tenderness or defects  Ankle:  Non-tender and no defects  Foot:  Non-tender and no defects Range of Motion:  Knee:  Range of motion is: 0-95                        Crepitus is  present  Ankle:  Range of motion is normal. Strength and Tone:  The right lower extremity has normal strength and tone. Stability:  Knee:  The knee is stable.  Ankle:  The ankle is stable.    The patient has been educated about the nature of the problem(s) and counseled on treatment options.  The patient appeared to understand what I have discussed and is in agreement with it.  Encounter Diagnoses  Name Primary?  . Chronic pain of right knee Yes  . Cigarette nicotine dependence without complication     PLAN Call if any problems.  Precautions discussed.  Continue current medications.   Return to clinic 3 months   Electronically Signed Darreld McleanWayne Maloni Musleh, MD 12/20/201710:43 AM

## 2016-06-07 ENCOUNTER — Telehealth: Payer: Self-pay | Admitting: Orthopaedic Surgery

## 2016-06-07 MED ORDER — HYDROCODONE-ACETAMINOPHEN 7.5-325 MG PO TABS: ORAL_TABLET | ORAL | 0 refills | Status: AC

## 2016-06-07 NOTE — Telephone Encounter (Signed)
Patient requests a refill on Hydrocodone/Acetaminophen (Norco)  7.5-325  Mgs.   This patient does have Medicaid insurance

## 2016-08-14 ENCOUNTER — Ambulatory Visit: Payer: Medicaid Other | Admitting: Orthopaedic Surgery

## 2016-08-14 ENCOUNTER — Encounter: Payer: Self-pay | Admitting: Orthopaedic Surgery

## 2018-01-13 ENCOUNTER — Other Ambulatory Visit (HOSPITAL_COMMUNITY): Payer: Self-pay | Admitting: Nurse Practitioner

## 2018-01-13 DIAGNOSIS — R102 Pelvic and perineal pain: Secondary | ICD-10-CM

## 2018-01-15 ENCOUNTER — Encounter (HOSPITAL_COMMUNITY): Payer: Self-pay

## 2018-01-15 ENCOUNTER — Ambulatory Visit (HOSPITAL_COMMUNITY): Payer: Medicaid Other

## 2018-01-21 ENCOUNTER — Ambulatory Visit (HOSPITAL_COMMUNITY)
Admission: RE | Admit: 2018-01-21 | Discharge: 2018-01-21 | Disposition: A | Discharge status: Home / Self Care | Payer: Medicaid Other | Source: Ambulatory Visit | Attending: Nurse Practitioner | Admitting: Nurse Practitioner

## 2018-01-21 DIAGNOSIS — R102 Pelvic and perineal pain: Secondary | ICD-10-CM | POA: Diagnosis not present

## 2021-02-13 ENCOUNTER — Other Ambulatory Visit: Payer: Self-pay | Admitting: Internal Medicine

## 2021-02-13 DIAGNOSIS — Z139 Encounter for screening, unspecified: Secondary | ICD-10-CM

## 2021-02-14 ENCOUNTER — Ambulatory Visit: Payer: Medicaid Other

## 2022-08-17 ENCOUNTER — Telehealth: Payer: Self-pay | Admitting: *Deleted

## 2022-08-17 NOTE — Telephone Encounter (Signed)
Referral for patient - patient has not seen previous rheumatology notes per [facility].

## 2022-08-20 NOTE — Telephone Encounter (Signed)
Noted
